# Patient Record
Sex: Female | Born: 1950 | Race: Asian | Hispanic: No | Smoking: Never smoker
Health system: Southern US, Community
[De-identification: ages and names within clinical notes are randomized; demographics above are authoritative.]

---

## 2019-08-20 ENCOUNTER — Ambulatory Visit: Payer: Self-pay | Attending: Family

## 2019-08-20 DIAGNOSIS — Z23 Encounter for immunization: Secondary | ICD-10-CM

## 2019-08-20 NOTE — Progress Notes (Signed)
   Covid-19 Vaccination Clinic  Name:  Catherine Alexander    MRN: [id number] DOB: 05/29/1950  08/20/2019  [name] was observed post Covid-19 immunization for 15 minutes without incident. She was provided with Vaccine Information Sheet and instruction to access the V-Safe system.   [name] was instructed to call 911 with any severe reactions post vaccine: Marland Kitchen Difficulty breathing  . Swelling of face and throat  . A fast heartbeat  . A bad rash all over body  . Dizziness and weakness   Immunizations Administered    Name Date Dose VIS Date Route   Moderna COVID-19 Vaccine 08/20/2019  3:46 PM 0.5 mL 04/28/2019 Intramuscular   Manufacturer: Moderna   Lot: [id number]   [id number]

## 2019-09-22 ENCOUNTER — Ambulatory Visit: Payer: Self-pay | Attending: Family

## 2019-09-22 DIAGNOSIS — Z23 Encounter for immunization: Secondary | ICD-10-CM

## 2019-09-22 NOTE — Progress Notes (Signed)
   Covid-19 Vaccination Clinic  Name:  Loyce Atamian    MRN: [id number] DOB: November 20, 1950  09/22/2019  Ms. Burczyk was observed post Covid-19 immunization for 15 minutes without incident. She was provided with Vaccine Information Sheet and instruction to access the V-Safe system.   Ms. Hoheisel was instructed to call 911 with any severe reactions post vaccine: Marland Kitchen Difficulty breathing  . Swelling of face and throat  . A fast heartbeat  . A bad rash all over body  . Dizziness and weakness   Immunizations Administered    Name Date Dose VIS Date Route   Moderna COVID-19 Vaccine 09/22/2019  3:16 PM 0.5 mL 04/2019 Intramuscular   Manufacturer: Moderna   Lot: [id number]   [place][id number]

## 2020-01-23 ENCOUNTER — Emergency Department (HOSPITAL_COMMUNITY): Payer: Self-pay

## 2020-01-23 ENCOUNTER — Inpatient Hospital Stay (HOSPITAL_COMMUNITY)
Admission: EM | Admit: 2020-01-23 | Discharge: 2020-01-25 | DRG: 640 | Disposition: A | Discharge status: Home / Self Care | Payer: Self-pay | Attending: Internal Medicine | Admitting: Internal Medicine

## 2020-01-23 ENCOUNTER — Encounter (HOSPITAL_COMMUNITY): Payer: Self-pay

## 2020-01-23 DIAGNOSIS — R627 Adult failure to thrive: Secondary | ICD-10-CM | POA: Diagnosis present

## 2020-01-23 DIAGNOSIS — Z20822 Contact with and (suspected) exposure to covid-19: Secondary | ICD-10-CM | POA: Diagnosis present

## 2020-01-23 DIAGNOSIS — Z9183 Wandering in diseases classified elsewhere: Secondary | ICD-10-CM

## 2020-01-23 DIAGNOSIS — S9032XA Contusion of left foot, initial encounter: Secondary | ICD-10-CM

## 2020-01-23 DIAGNOSIS — R238 Other skin changes: Secondary | ICD-10-CM | POA: Diagnosis present

## 2020-01-23 DIAGNOSIS — F03918 Unspecified dementia, unspecified severity, with other behavioral disturbance: Secondary | ICD-10-CM | POA: Diagnosis present

## 2020-01-23 DIAGNOSIS — R748 Abnormal levels of other serum enzymes: Secondary | ICD-10-CM | POA: Diagnosis present

## 2020-01-23 DIAGNOSIS — G20 Parkinson's disease: Secondary | ICD-10-CM | POA: Diagnosis present

## 2020-01-23 DIAGNOSIS — J189 Pneumonia, unspecified organism: Secondary | ICD-10-CM

## 2020-01-23 DIAGNOSIS — W1830XA Fall on same level, unspecified, initial encounter: Secondary | ICD-10-CM | POA: Diagnosis present

## 2020-01-23 DIAGNOSIS — R64 Cachexia: Secondary | ICD-10-CM | POA: Diagnosis present

## 2020-01-23 DIAGNOSIS — D181 Lymphangioma, any site: Secondary | ICD-10-CM | POA: Diagnosis present

## 2020-01-23 DIAGNOSIS — R21 Rash and other nonspecific skin eruption: Secondary | ICD-10-CM | POA: Diagnosis present

## 2020-01-23 DIAGNOSIS — I6203 Nontraumatic chronic subdural hemorrhage: Secondary | ICD-10-CM | POA: Diagnosis present

## 2020-01-23 DIAGNOSIS — F0281 Dementia in other diseases classified elsewhere with behavioral disturbance: Secondary | ICD-10-CM | POA: Diagnosis present

## 2020-01-23 DIAGNOSIS — K5900 Constipation, unspecified: Secondary | ICD-10-CM | POA: Diagnosis present

## 2020-01-23 DIAGNOSIS — S8001XA Contusion of right knee, initial encounter: Secondary | ICD-10-CM

## 2020-01-23 DIAGNOSIS — E860 Dehydration: Principal | ICD-10-CM | POA: Diagnosis present

## 2020-01-23 DIAGNOSIS — R001 Bradycardia, unspecified: Secondary | ICD-10-CM | POA: Diagnosis present

## 2020-01-23 DIAGNOSIS — R451 Restlessness and agitation: Secondary | ICD-10-CM | POA: Diagnosis not present

## 2020-01-23 DIAGNOSIS — S8002XA Contusion of left knee, initial encounter: Secondary | ICD-10-CM

## 2020-01-23 DIAGNOSIS — S065XAA Traumatic subdural hemorrhage with loss of consciousness status unknown, initial encounter: Secondary | ICD-10-CM

## 2020-01-23 LAB — CBC WITH DIFFERENTIAL/PLATELET
Abs Immature Granulocytes: 0.08 10*3/uL — ABNORMAL HIGH (ref 0.00–0.07)
Basophils Absolute: 0 10*3/uL (ref 0.0–0.1)
Basophils Relative: 0 %
Eosinophils Absolute: 0 10*3/uL (ref 0.0–0.5)
Eosinophils Relative: 0 %
HCT: 39.2 % (ref 36.0–46.0)
Hemoglobin: 12.9 g/dL (ref 12.0–15.0)
Immature Granulocytes: 1 %
Lymphocytes Relative: 5 %
Lymphs Abs: 0.7 10*3/uL (ref 0.7–4.0)
MCH: 30.9 pg (ref 26.0–34.0)
MCHC: 32.9 g/dL (ref 30.0–36.0)
MCV: 94 fL (ref 80.0–100.0)
Monocytes Absolute: 0.6 10*3/uL (ref 0.1–1.0)
Monocytes Relative: 4 %
Neutro Abs: 12.8 10*3/uL — ABNORMAL HIGH (ref 1.7–7.7)
Neutrophils Relative %: 90 %
Platelets: 164 10*3/uL (ref 150–400)
RBC: 4.17 MIL/uL (ref 3.87–5.11)
RDW: 12.5 % (ref 11.5–15.5)
WBC: 14.3 10*3/uL — ABNORMAL HIGH (ref 4.0–10.5)
nRBC: 0 % (ref 0.0–0.2)

## 2020-01-23 LAB — TYPE AND SCREEN
ABO/RH(D): B POS
Antibody Screen: NEGATIVE

## 2020-01-23 LAB — COMPREHENSIVE METABOLIC PANEL
ALT: 33 U/L (ref 0–44)
AST: 60 U/L — ABNORMAL HIGH (ref 15–41)
Albumin: 3.5 g/dL (ref 3.5–5.0)
Alkaline Phosphatase: 34 U/L — ABNORMAL LOW (ref 38–126)
Anion gap: 12 (ref 5–15)
BUN: 24 mg/dL — ABNORMAL HIGH (ref 8–23)
CO2: 25 mmol/L (ref 22–32)
Calcium: 9.2 mg/dL (ref 8.9–10.3)
Chloride: 104 mmol/L (ref 98–111)
Creatinine, Ser: 1.05 mg/dL — ABNORMAL HIGH (ref 0.44–1.00)
GFR calc Af Amer: >60 mL/min (ref >60)
GFR calc non Af Amer: 54 mL/min — ABNORMAL LOW (ref >60)
Glucose, Bld: 81 mg/dL (ref 70–99)
Potassium: 3.7 mmol/L (ref 3.5–5.1)
Sodium: 141 mmol/L (ref 135–145)
Total Bilirubin: 1.5 mg/dL — ABNORMAL HIGH (ref 0.3–1.2)
Total Protein: 6.9 g/dL (ref 6.5–8.1)

## 2020-01-23 LAB — URINALYSIS, ROUTINE W REFLEX MICROSCOPIC
Bilirubin Urine: NEGATIVE
Glucose, UA: NEGATIVE mg/dL
Ketones, ur: 20 mg/dL — AB
Leukocytes,Ua: NEGATIVE
Nitrite: NEGATIVE
Protein, ur: 100 mg/dL — AB
Specific Gravity, Urine: 1.019 (ref 1.005–1.030)
pH: 5 (ref 5.0–8.0)

## 2020-01-23 LAB — PROTIME-INR
INR: 1.2 (ref 0.8–1.2)
Prothrombin Time: 14.6 seconds (ref 11.4–15.2)

## 2020-01-23 LAB — SARS CORONAVIRUS 2 BY RT PCR (HOSPITAL ORDER, PERFORMED IN ~~LOC~~ HOSPITAL LAB): SARS Coronavirus 2: NEGATIVE

## 2020-01-23 LAB — CK: Total CK: 951 U/L — ABNORMAL HIGH (ref 38–234)

## 2020-01-23 MED ORDER — SODIUM CHLORIDE 0.9 % IV BOLUS
1000.0000 mL | Freq: Once | INTRAVENOUS | Status: AC
  Administered 2020-01-23: 1000 mL via INTRAVENOUS

## 2020-01-23 NOTE — Discharge Instructions (Signed)
You were seen in the emergency department for evaluation of injuries from a fall.  You had a CAT scan of your head that showed a small subdural hemorrhage on the left.  We reviewed this with Dr. Trenton Gammon neurosurgery who did not feel any intervention was necessary.  You also had x-rays of both of your knees and your left foot that did not show any obvious fractures.

## 2020-01-23 NOTE — ED Notes (Signed)
Pt returns from radiology son at bedside.

## 2020-01-23 NOTE — ED Notes (Signed)
Pt transported to radiology.

## 2020-01-23 NOTE — Progress Notes (Signed)
Elderly female with dementia and PD.  Sp probable fall >24 hrs ago.  Scan shows small left convexity sdh superimposed on a chronic appearing hygroma.  No mass effect.  No anticoagulation use or anti plt therapy.    Rec fu scan in 6 hrs.  If stable then patient may be discharged with family

## 2020-01-23 NOTE — ED Provider Notes (Signed)
Patient with SDH after being found outside, assume fall Plan per neurosurgery, repeat CT at 1:00 If no worse, "stable", she can be discharged Catherine Alexander) In office follow up.   2:00 - I spoke to the patient's son. Patient sleeping now. Apparently, she disappeared at lunch time Friday 8/27 and wasn't found until 4:30 Saturday afternoon behind a neighbor's house sitting under a bush.   Patient with history of Parkinson's, dementia, speaks chinese only. Son assisting with interpretation.   Repeat head CT performed and is unchanged. However, patient's blood pressures have been decreasing steadily, starting with 121/30 on arrival, 91/33 at 1:30. She was given a liter of fluids, additional fluids ordered.   Labs show mildly elevated CK of 951, leukocytosis of 14.3. No UTI evident. Feel the patient would benefit from admission for stabilization, continuation of hydration. Son expresses that care at home is becoming more difficult as the patient requires assistance with most ADL's, and would benefit from SW consult as well to explore care options for her.   Hospitalist paged for admission.    Charlann Lange, PA-C 01/24/20 0229    Hayden Rasmussen, MD 01/24/20 [phone]

## 2020-01-23 NOTE — ED Triage Notes (Signed)
Pt arrive by EMS Per EMS pt ran away from home yesterday  (lives at home with her son ) . Per EMS pt was found in the woods today by [place] dogs [place]. EMS reports pt has an hx of dementia, parkinson diease and only speak chinese mandarin. Upon arrival to room pt has bruises on both knees and left foot toes and some sort of rash on both hands, Pt was immediately clean up and place in gown and on the monitor x 3

## 2020-01-23 NOTE — ED Provider Notes (Signed)
[facility] EMERGENCY DEPARTMENT Provider Note   CSN: [id number] Arrival date & time: 01/23/20  1714     History Chief Complaint  Patient presents with  . Failure To Thrive    Catherine Alexander is a 69 y.o. female.  Level 5 caveat secondary to dementia and language barrier.  She is Mandarin speaking female who lives at home with her son and his wife.  Last seen at 10:30 AM yesterday.  Apparently wandered out of the house and was unable to be located until today.  Please found her in a bush in the woods.  Patient not answering any questions or following commands.  Son states she has a history of Parkinson's but does not take any medication.  Was diagnosed in [place].  The history is provided by the EMS personnel and a relative. The history is limited by a language barrier.  Fall This is a new problem. The current episode started 12 to 24 hours ago. The problem has not changed since onset.Nothing aggravates the symptoms. Nothing relieves the symptoms. She has tried nothing for the symptoms. The treatment provided no relief.       No past medical history on file.  There are no problems to display for this patient.   History reviewed. No pertinent surgical history.   OB History   No obstetric history on file.     No family history on file.  Social History   Tobacco Use  . Smoking status: Not on file  Substance Use Topics  . Alcohol use: Not on file  . Drug use: Not on file    Home Medications Prior to Admission medications   Not on File    Allergies    Patient has no known allergies.  Review of Systems   Review of Systems  Unable to perform ROS: Dementia    Physical Exam Updated Vital Signs BP (!) 125/48   Pulse 74   Temp 98.6 F (37 C) (Rectal)   Resp 16   SpO2 100%   Physical Exam Vitals and nursing note reviewed.  Constitutional:      General: She is not in acute distress.    Appearance: Normal appearance. She is cachectic.  HENT:      Head: Normocephalic and atraumatic.  Eyes:     Conjunctiva/sclera: Conjunctivae normal.  Neck:     Comments: Patient in cervical collar trach midline Cardiovascular:     Rate and Rhythm: Normal rate and regular rhythm.     Heart sounds: No murmur heard.   Pulmonary:     Effort: Pulmonary effort is normal. No respiratory distress.     Breath sounds: Normal breath sounds.  Abdominal:     Palpations: Abdomen is soft.     Tenderness: There is no abdominal tenderness.  Musculoskeletal:        General: Signs of injury present. Normal range of motion.     Comments: She has full range of motion of her upper extremities without any pain or limitations.  She has evidence of abrasions or bites on both of her forearms and hands.  Lower extremity she has abrasions and contusion of both knees.  Left foot has some bruising on her toes.  No gross deformities.  Skin:    General: Skin is warm and dry.     Capillary Refill: Capillary refill takes less than 2 seconds.  Neurological:     General: No focal deficit present.     Comments: Patient initially awake not  following commands.  Withdraws all 4 extremities equally.  No focal findings.  Further neurologic evaluation documented later in chart.     ED Results / Procedures / Treatments   Labs (all labs ordered are listed, but only abnormal results are displayed) Labs Reviewed  COMPREHENSIVE METABOLIC PANEL - Abnormal; Notable for the following components:      Result Value   BUN 24 (*)    Creatinine, Ser 1.05 (*)    AST 60 (*)    Alkaline Phosphatase 34 (*)    Total Bilirubin 1.5 (*)    GFR calc non Af Amer 54 (*)    All other components within normal limits  CBC WITH DIFFERENTIAL/PLATELET - Abnormal; Notable for the following components:   WBC 14.3 (*)    Neutro Abs 12.8 (*)    Abs Immature Granulocytes 0.08 (*)    All other components within normal limits  CK - Abnormal; Notable for the following components:   Total CK 951 (*)    All  other components within normal limits  URINALYSIS, ROUTINE W REFLEX MICROSCOPIC - Abnormal; Notable for the following components:   Color, Urine AMBER (*)    APPearance HAZY (*)    Hgb urine dipstick MODERATE (*)    Ketones, ur 20 (*)    Protein, ur 100 (*)    Bacteria, UA RARE (*)    All other components within normal limits  SARS CORONAVIRUS 2 BY RT PCR (HOSPITAL ORDER, [facility])  URINE CULTURE  PROTIME-INR  TYPE AND SCREEN  ABO/RH    EKG None  Radiology CT Head Wo Contrast  Result Date: 01/23/2020 CLINICAL DATA:  Head and neck trauma with pain. EXAM: CT HEAD WITHOUT CONTRAST CT CERVICAL SPINE WITHOUT CONTRAST TECHNIQUE: Multidetector CT imaging of the head and cervical spine was performed following the standard protocol without intravenous contrast. Multiplanar CT image reconstructions of the cervical spine were also generated. COMPARISON:  None. FINDINGS: CT HEAD FINDINGS Brain: Small to moderate volume acute subdural blood products overlie the left frontal and temporal lobes. A subdural hygroma overlies the left cerebral convexity and may reflect chronic subdural hemorrhage. There is no significant associated mass effect. No evidence of acute infarction, hydrocephalus, or midline shift. There is mild cerebral volume loss with associated ex vacuo dilatation. Vascular: There are vascular calcifications in the carotid siphons. Skull: Normal. Negative for fracture or focal lesion. Sinuses/Orbits: No acute finding. Other: None. CT CERVICAL SPINE FINDINGS Alignment: Normal. Skull base and vertebrae: No acute fracture. No primary bone lesion or focal pathologic process. Soft tissues and spinal canal: No prevertebral fluid or swelling. No visible canal hematoma. Disc levels:  Mild degenerative changes are noted. Upper chest: Negative. Other: Biapical pleural scarring is partially imaged. IMPRESSION: 1. Small to moderate volume acute subdural hemorrhage overlying the  left frontal and temporal lobes. No significant associated mass effect or midline shift. 2. 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 3. No acute fracture or subluxation of the cervical spine. These results were called by telephone at the time of interpretation on 01/23/2020 at 7:22 pm to provider [facility] , who verbally acknowledged these results. Electronically Signed   By: Zerita Boers M.D.   On: 01/23/2020 19:22   CT Cervical Spine Wo Contrast  Result Date: 01/23/2020 CLINICAL DATA:  Head and neck trauma with pain. EXAM: CT HEAD WITHOUT CONTRAST CT CERVICAL SPINE WITHOUT CONTRAST TECHNIQUE: Multidetector CT imaging of the head  and cervical spine was performed following the standard protocol without intravenous contrast. Multiplanar CT image reconstructions of the cervical spine were also generated. COMPARISON:  None. FINDINGS: CT HEAD FINDINGS Brain: Small to moderate volume acute subdural blood products overlie the left frontal and temporal lobes. A subdural hygroma overlies the left cerebral convexity and may reflect chronic subdural hemorrhage. There is no significant associated mass effect. No evidence of acute infarction, hydrocephalus, or midline shift. There is mild cerebral volume loss with associated ex vacuo dilatation. Vascular: There are vascular calcifications in the carotid siphons. Skull: Normal. Negative for fracture or focal lesion. Sinuses/Orbits: No acute finding. Other: None. CT CERVICAL SPINE FINDINGS Alignment: Normal. Skull base and vertebrae: No acute fracture. No primary bone lesion or focal pathologic process. Soft tissues and spinal canal: No prevertebral fluid or swelling. No visible canal hematoma. Disc levels:  Mild degenerative changes are noted. Upper chest: Negative. Other: Biapical pleural scarring is partially imaged. IMPRESSION: 1. Small to moderate volume acute subdural hemorrhage overlying the left  frontal and temporal lobes. No significant associated mass effect or midline shift. 2. 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 3. No acute fracture or subluxation of the cervical spine. These results were called by telephone at the time of interpretation on 01/23/2020 at 7:22 pm to provider [facility] , who verbally acknowledged these results. Electronically Signed   By: Zerita Boers M.D.   On: 01/23/2020 19:22   DG Knee Complete 4 Views Left  Result Date: 01/23/2020 CLINICAL DATA:  Fall with knee pain EXAM: LEFT KNEE - COMPLETE 4+ VIEW COMPARISON:  None. FINDINGS: No evidence of fracture, dislocation, or joint effusion. No evidence of arthropathy or other focal bone abnormality. Soft tissues are unremarkable. IMPRESSION: Negative. Electronically Signed   By: Zerita Boers M.D.   On: 01/23/2020 18:40   DG Knee Complete 4 Views Right  Result Date: 01/23/2020 CLINICAL DATA:  Fall with knee pain EXAM: RIGHT KNEE - COMPLETE 4+ VIEW COMPARISON:  None. FINDINGS: No evidence of fracture, dislocation, or joint effusion. No evidence of arthropathy or other focal bone abnormality. Soft tissues are unremarkable. IMPRESSION: Negative. Electronically Signed   By: Zerita Boers M.D.   On: 01/23/2020 18:41   DG Foot Complete Left  Result Date: 01/23/2020 CLINICAL DATA:  Fall with foot pain. EXAM: LEFT FOOT - COMPLETE 3+ VIEW COMPARISON:  None. FINDINGS: There is no evidence of fracture or dislocation. There is no evidence of arthropathy or other focal bone abnormality. Soft tissues are unremarkable. IMPRESSION: Negative. Electronically Signed   By: Zerita Boers M.D.   On: 01/23/2020 18:42    Procedures Procedures (including critical care time)  Medications Ordered in ED Medications  sodium chloride 0.9 % bolus 1,000 mL (0 mLs Intravenous Stopped 01/23/20 2001)    ED Course  I have reviewed the triage vital signs and the nursing  notes.  Pertinent labs & imaging results that were available during my care of the patient were reviewed by me and considered in my medical decision making (see chart for details).  Clinical Course as of Jan 23 2315  Sat Jan 23, 2020  1922 Received a call from radiology patient has a left subdural mild to moderate.  No evidence of herniation.  Also he noted some hygroma there so does not know if this played into a chronic area.   [MB]  1928 I was able to get a little bit better neurologic exam with her  son's assistance.  She denies having headache.  She is able to lift all 4 extremities without any obvious weakness.  No facial asymmetry.   [MB]  1956 Discussed with neurosurgery Dr. Trenton Gammon.  He felt that the subdural was not very large and she would need a repeat scan in 6 hours.  If this is unchanged he felt she could be discharged.  Will review with patient's son.   [MB]  2221 Continues to open eyes to voice.  She will follow me around the room with her eyes. She will grasp my hands with equal grip bilaterally.   [SJ]    Clinical Course User Index [MB] Hayden Rasmussen, MD [SJ] Joy, Helane Gunther, PA-C   MDM Rules/Calculators/A&P                         This patient complains of found down outside after being missing for almost 20 hours.; this involves an extensive number of treatment Options and is a complaint that carries with it a high risk of complications and Morbidity. The differential includes dehydration, rhabdomyolysis, stroke, bleed, fracture, contusion  I ordered, reviewed and interpreted labs, which included CBC with elevated white count, normal hemoglobin, chemistries fairly normal other than mild elevation in creatinine, CPK mildly elevated 951, Covid testing negative I ordered medication IV fluids I ordered imaging studies which included CT head CT C-spine along with x-rays of her chest bilateral knees and left foot and I independently    visualized and interpreted imaging  which showed acute subdural left with no shift Additional history obtained from EMS, patient's son Previous records obtained and reviewed in epic, no recent visits I consulted neurosurgery Dr. Trenton Gammon and discussed lab and imaging findings  Critical Interventions: None  After the interventions stated above, I reevaluated the patient and found patient to be more alert and interactive and conversant with her son and Mandarin.  Showing no focal neurologic signs.  Denies any headache.  Per neurosurgery recommendations we will get a repeat head CT and if no signs of worsening will discharge to follow-up outpatient as needed.  Reviewed this with the patient's son who is comfortable with plan.  Signed out to [place] who will reassess the patient after 6-hour CT or signed out to oncoming provider.  Final Clinical Impression(s) / ED Diagnoses Final diagnoses:  SDH (subdural hematoma) (HCC)  Contusion of left knee, initial encounter  Contusion of right knee, initial encounter  Contusion of left foot, initial encounter    Rx / DC Orders ED Discharge Orders    None       Hayden Rasmussen, MD 01/23/20 [phone]

## 2020-01-24 ENCOUNTER — Other Ambulatory Visit: Payer: Self-pay

## 2020-01-24 ENCOUNTER — Observation Stay (HOSPITAL_COMMUNITY): Payer: Self-pay

## 2020-01-24 ENCOUNTER — Emergency Department (HOSPITAL_COMMUNITY): Payer: Self-pay

## 2020-01-24 ENCOUNTER — Encounter (HOSPITAL_COMMUNITY): Payer: Self-pay | Admitting: Internal Medicine

## 2020-01-24 DIAGNOSIS — S065X9A Traumatic subdural hemorrhage with loss of consciousness of unspecified duration, initial encounter: Secondary | ICD-10-CM

## 2020-01-24 DIAGNOSIS — F03918 Unspecified dementia, unspecified severity, with other behavioral disturbance: Secondary | ICD-10-CM | POA: Diagnosis present

## 2020-01-24 DIAGNOSIS — F0391 Unspecified dementia with behavioral disturbance: Secondary | ICD-10-CM

## 2020-01-24 DIAGNOSIS — R238 Other skin changes: Secondary | ICD-10-CM | POA: Diagnosis present

## 2020-01-24 DIAGNOSIS — R748 Abnormal levels of other serum enzymes: Secondary | ICD-10-CM

## 2020-01-24 DIAGNOSIS — E860 Dehydration: Secondary | ICD-10-CM | POA: Diagnosis present

## 2020-01-24 DIAGNOSIS — S065XAA Traumatic subdural hemorrhage with loss of consciousness status unknown, initial encounter: Secondary | ICD-10-CM

## 2020-01-24 DIAGNOSIS — G20 Parkinson's disease: Secondary | ICD-10-CM | POA: Diagnosis present

## 2020-01-24 HISTORY — DX: Unspecified dementia with behavioral disturbance: F03.91

## 2020-01-24 HISTORY — DX: Unspecified dementia, unspecified severity, with other behavioral disturbance: F03.918

## 2020-01-24 LAB — MAGNESIUM: Magnesium: 1.8 mg/dL (ref 1.7–2.4)

## 2020-01-24 LAB — CBC WITH DIFFERENTIAL/PLATELET
Abs Immature Granulocytes: 0.05 10*3/uL (ref 0.00–0.07)
Basophils Absolute: 0 10*3/uL (ref 0.0–0.1)
Basophils Relative: 0 %
Eosinophils Absolute: 0 10*3/uL (ref 0.0–0.5)
Eosinophils Relative: 0 %
HCT: 37.8 % (ref 36.0–46.0)
Hemoglobin: 12.3 g/dL (ref 12.0–15.0)
Immature Granulocytes: 0 %
Lymphocytes Relative: 6 %
Lymphs Abs: 0.7 10*3/uL (ref 0.7–4.0)
MCH: 31 pg (ref 26.0–34.0)
MCHC: 32.5 g/dL (ref 30.0–36.0)
MCV: 95.2 fL (ref 80.0–100.0)
Monocytes Absolute: 0.6 10*3/uL (ref 0.1–1.0)
Monocytes Relative: 5 %
Neutro Abs: 10.3 10*3/uL — ABNORMAL HIGH (ref 1.7–7.7)
Neutrophils Relative %: 89 %
Platelets: 151 10*3/uL (ref 150–400)
RBC: 3.97 MIL/uL (ref 3.87–5.11)
RDW: 12.5 % (ref 11.5–15.5)
WBC: 11.7 10*3/uL — ABNORMAL HIGH (ref 4.0–10.5)
nRBC: 0 % (ref 0.0–0.2)

## 2020-01-24 LAB — COMPREHENSIVE METABOLIC PANEL
ALT: 29 U/L (ref 0–44)
AST: 53 U/L — ABNORMAL HIGH (ref 15–41)
Albumin: 3 g/dL — ABNORMAL LOW (ref 3.5–5.0)
Alkaline Phosphatase: 33 U/L — ABNORMAL LOW (ref 38–126)
Anion gap: 10 (ref 5–15)
BUN: 20 mg/dL (ref 8–23)
CO2: 22 mmol/L (ref 22–32)
Calcium: 8.5 mg/dL — ABNORMAL LOW (ref 8.9–10.3)
Chloride: 109 mmol/L (ref 98–111)
Creatinine, Ser: 0.79 mg/dL (ref 0.44–1.00)
GFR calc Af Amer: >60 mL/min (ref >60)
GFR calc non Af Amer: >60 mL/min (ref >60)
Glucose, Bld: 63 mg/dL — ABNORMAL LOW (ref 70–99)
Potassium: 3.8 mmol/L (ref 3.5–5.1)
Sodium: 141 mmol/L (ref 135–145)
Total Bilirubin: 1.3 mg/dL — ABNORMAL HIGH (ref 0.3–1.2)
Total Protein: 6.2 g/dL — ABNORMAL LOW (ref 6.5–8.1)

## 2020-01-24 LAB — TSH: TSH: 1.984 u[IU]/mL (ref 0.350–4.500)

## 2020-01-24 LAB — HIV ANTIBODY (ROUTINE TESTING W REFLEX): HIV Screen 4th Generation wRfx: NONREACTIVE

## 2020-01-24 LAB — VITAMIN B12: Vitamin B-12: 895 pg/mL (ref 180–914)

## 2020-01-24 LAB — CK: Total CK: 619 U/L — ABNORMAL HIGH (ref 38–234)

## 2020-01-24 LAB — ABO/RH: ABO/RH(D): B POS

## 2020-01-24 LAB — FOLATE: Folate: 30.3 ng/mL (ref >5.9)

## 2020-01-24 MED ORDER — POLYETHYLENE GLYCOL 3350 17 G PO PACK
17.0000 g | PACK | Freq: Every day | ORAL | Status: DC | PRN
  Administered 2020-01-24: 17 g via ORAL
  Filled 2020-01-24: qty 1

## 2020-01-24 MED ORDER — LACTATED RINGERS IV SOLN: INTRAVENOUS | Status: AC

## 2020-01-24 MED ORDER — ONDANSETRON HCL 4 MG PO TABS: 4.0000 mg | ORAL_TABLET | Freq: Four times a day (QID) | ORAL | Status: DC | PRN

## 2020-01-24 MED ORDER — ACETAMINOPHEN 325 MG PO TABS: 650.0000 mg | ORAL_TABLET | Freq: Four times a day (QID) | ORAL | Status: DC | PRN

## 2020-01-24 MED ORDER — LACTATED RINGERS IV SOLN: INTRAVENOUS | Status: DC

## 2020-01-24 MED ORDER — SODIUM CHLORIDE 0.9 % IV BOLUS
1000.0000 mL | Freq: Once | INTRAVENOUS | Status: AC
  Administered 2020-01-24: 1000 mL via INTRAVENOUS

## 2020-01-24 MED ORDER — ONDANSETRON HCL 4 MG/2ML IJ SOLN: 4.0000 mg | Freq: Four times a day (QID) | INTRAMUSCULAR | Status: DC | PRN

## 2020-01-24 MED ORDER — ACETAMINOPHEN 650 MG RE SUPP: 650.0000 mg | Freq: Four times a day (QID) | RECTAL | Status: DC | PRN

## 2020-01-24 NOTE — ED Notes (Signed)
Patient transported to CT 

## 2020-01-24 NOTE — H&P (Addendum)
History and Physical    Catherine Alexander ERX:[id number] DOB: Dec 15, 1950 DOA: 01/23/2020  PCP: No primary care provider on file.  Patient coming from: Home   Chief Complaint:  Chief Complaint  Patient presents with  . Failure To Thrive     HPI:    69 year old [place] speaking female with past medical history of advanced dementia, Parkinson disease who presents to [facility] emergency department, brought in by EMS after the patient had run away from home, being found behind a neighbor's house sitting under a bush.  Patient is an extremely poor historian secondary to advanced dementia which is further complicated by language barrier.  I have attempted to contact the son to obtain further history but have been unsuccessful as Catherine is not answering the phone.  Therefore the majority of this history has been obtained from discussions with the emergency department staff/documentation.  Patient apparently escaped from her home at lunch on Friday.  Family could not locate her until approximately 4:30 PM the following day on Saturday.  Patient had likely been wandering in the woods and was found behind a neighbor's house sitting under a bush.  Upon being found, patient was lethargic and not at baseline and therefore was brought in by EMS for evaluation.  Upon evaluation by the emergency department staff, CT imaging of the head revealed small to moderate sized subdural hemorrhage of the left frontal and temporal lobes.  Case was discussed with Dr. Trenton Gammon with orthopedic surgery who recommended observation for at least 6 hours and repeat CT imaging with discharge home if there was no change.  Patient was also found to be clinically dehydrated with borderline low blood pressures, bradycardia and somewhat elevated creatine kinase of 951.  Hospitalist group was then called to assess the patient for admission to the hospital.   Review of Systems: Unable to obtain due to patient advanced dementia.   Past  Medical History:  Diagnosis Date  . Dementia with behavioral disturbance ([place]) 01/24/2020    History reviewed. No pertinent surgical history.   reports that she has never smoked. She has never used smokeless tobacco. No history on file for alcohol use and drug use.  No Known Allergies  Family History  Problem Relation Age of Onset  . Heart disease Neg Hx      Prior to Admission medications   Not on File    Physical Exam: Vitals:   01/24/20 0300 01/24/20 0340 01/24/20 0400 01/24/20 0500  BP: (!) 96/47 (!) 94/38 (!) 106/44 (!) 112/46  Pulse: (!) 48 (!) 46 (!) 45 (!) 58  Resp: 18 15 14 16   Temp: 98.7 F (37.1 C)     TempSrc: Rectal     SpO2: 100%       Constitutional: Lethargic but arousable, not in any acute distress. Skin: Numerous excoriations and abrasions over all extremities.  Notable purplish discoloration of the second and fourth toes of the left foot.  Extremely poor skin turgor noted. Eyes: Pupils are equally reactive to light.  No evidence of scleral icterus or conjunctival pallor.  ENMT: Moist mucous membranes noted.  Posterior pharynx clear of any exudate or lesions.   Neck: normal, supple, no masses, no thyromegaly.  No evidence of jugular venous distension.   Respiratory: clear to auscultation bilaterally, no wheezing, no crackles. Normal respiratory effort. No accessory muscle use.  Cardiovascular: Notable bradycardic rhythm and regular rate, no murmurs / rubs / gallops. No extremity edema.  All pulses present however there is slow  capillary refill of the second and fourth toe of the left foot.  No carotid bruits.  Chest:   Nontender without crepitus or deformity.   Back:   Nontender without crepitus or deformity. Abdomen: Abdomen is soft and nontender.  No evidence of intra-abdominal masses.  Positive bowel sounds noted in all quadrants.   Musculoskeletal: Notable poor muscle tone.  No joint deformity upper and lower extremities. Good ROM, no contractures.    Neurologic: Patient is responsive to both verbal and painful stimuli.  Patient is lethargic but arousable.  Patient is not following commands although this is limited by the language barrier. Psychiatric: Unable to fully assess due to advanced dementia.  Patient currently does not seem to possess insight as to her current situation.   Labs on Admission: I have personally reviewed following labs and imaging studies -   CBC: Recent Labs  Lab 01/23/20 1902  WBC 14.3*  NEUTROABS 12.8*  HGB 12.9  HCT 39.2  MCV 94.0  PLT 607   Basic Metabolic Panel: Recent Labs  Lab 01/23/20 1902  NA 141  K 3.7  CL 104  CO2 25  GLUCOSE 81  BUN 24*  CREATININE 1.05*  CALCIUM 9.2   GFR: CrCl cannot be calculated (Unknown ideal weight.). Liver Function Tests: Recent Labs  Lab 01/23/20 1902  AST 60*  ALT 33  ALKPHOS 34*  BILITOT 1.5*  PROT 6.9  ALBUMIN 3.5   No results for input(s): LIPASE, AMYLASE in the last 168 hours. No results for input(s): AMMONIA in the last 168 hours. Coagulation Profile: Recent Labs  Lab 01/23/20 2022  INR 1.2   Cardiac Enzymes: Recent Labs  Lab 01/23/20 1902  CKTOTAL 951*   BNP (last 3 results) No results for input(s): PROBNP in the last 8760 hours. HbA1C: No results for input(s): HGBA1C in the last 72 hours. CBG: No results for input(s): GLUCAP in the last 168 hours. Lipid Profile: No results for input(s): CHOL, HDL, LDLCALC, TRIG, CHOLHDL, LDLDIRECT in the last 72 hours. Thyroid Function Tests: No results for input(s): TSH, T4TOTAL, FREET4, T3FREE, THYROIDAB in the last 72 hours. Anemia Panel: No results for input(s): VITAMINB12, FOLATE, FERRITIN, TIBC, IRON, RETICCTPCT in the last 72 hours. Urine analysis:    Component Value Date/Time   COLORURINE AMBER (A) 01/23/2020 1753   APPEARANCEUR HAZY (A) 01/23/2020 1753   LABSPEC 1.019 01/23/2020 1753   PHURINE 5.0 01/23/2020 1753   GLUCOSEU NEGATIVE 01/23/2020 1753   HGBUR MODERATE (A)  01/23/2020 1753   BILIRUBINUR NEGATIVE 01/23/2020 1753   KETONESUR 20 (A) 01/23/2020 1753   PROTEINUR 100 (A) 01/23/2020 1753   NITRITE NEGATIVE 01/23/2020 1753   LEUKOCYTESUR NEGATIVE 01/23/2020 1753    Radiological Exams on Admission - Personally Reviewed: CT Head Wo Contrast  Result Date: 01/24/2020 CLINICAL DATA:  69 year old female with follow-up subdural hemorrhage. EXAM: CT HEAD WITHOUT CONTRAST TECHNIQUE: Contiguous axial images were obtained from the base of the skull through the vertex without intravenous contrast. COMPARISON:  Head CT dated 01/23/2020. FINDINGS: Brain: No significant interval change in the subdural hemorrhage over the left frontal and temporal lobes compared to the earlier CT. Small posterior parafalcine subdural hemorrhage appears relatively similar to prior CT. No new bleed. Stable left hemispheric subdural hygroma. There is mild age-related atrophy and chronic microvascular ischemic changes. There is no mass effect or midline shift. Vascular: No hyperdense vessel or unexpected calcification. Skull: Normal. Negative for fracture or focal lesion. Sinuses/Orbits: Mild mucoperiosteal thickening of paranasal sinuses. The mastoid air  cells are clear. No air-fluid level. Other: None IMPRESSION: 1. No significant interval change in the size of the left frontotemporal or posterior para falcine subdural hemorrhages compared to the earlier CT. No new bleed. Continued follow-up recommended. 2. Stable left hemispheric subdural hygroma. 3. Mild age-related atrophy and chronic microvascular ischemic changes. Electronically Signed   By: Anner Crete M.D.   On: 01/24/2020 00:54   CT Head Wo Contrast  Result Date: 01/23/2020 CLINICAL DATA:  Head and neck trauma with pain. EXAM: CT HEAD WITHOUT CONTRAST CT CERVICAL SPINE WITHOUT CONTRAST TECHNIQUE: Multidetector CT imaging of the head and cervical spine was performed following the standard protocol without intravenous contrast.  Multiplanar CT image reconstructions of the cervical spine were also generated. COMPARISON:  None. FINDINGS: CT HEAD FINDINGS Brain: Small to moderate volume acute subdural blood products overlie the left frontal and temporal lobes. A subdural hygroma overlies the left cerebral convexity and may reflect chronic subdural hemorrhage. There is no significant associated mass effect. No evidence of acute infarction, hydrocephalus, or midline shift. There is mild cerebral volume loss with associated ex vacuo dilatation. Vascular: There are vascular calcifications in the carotid siphons. Skull: Normal. Negative for fracture or focal lesion. Sinuses/Orbits: No acute finding. Other: None. CT CERVICAL SPINE FINDINGS Alignment: Normal. Skull base and vertebrae: No acute fracture. No primary bone lesion or focal pathologic process. Soft tissues and spinal canal: No prevertebral fluid or swelling. No visible canal hematoma. Disc levels:  Mild degenerative changes are noted. Upper chest: Negative. Other: Biapical pleural scarring is partially imaged. IMPRESSION: 1. Small to moderate volume acute subdural hemorrhage overlying the left frontal and temporal lobes. No significant associated mass effect or midline shift. 2. 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 3. No acute fracture or subluxation of the cervical spine. These results were called by telephone at the time of interpretation on 01/23/2020 at 7:22 pm to provider [facility] , who verbally acknowledged these results. Electronically Signed   By: Zerita Boers M.D.   On: 01/23/2020 19:22   CT Cervical Spine Wo Contrast  Result Date: 01/23/2020 CLINICAL DATA:  Head and neck trauma with pain. EXAM: CT HEAD WITHOUT CONTRAST CT CERVICAL SPINE WITHOUT CONTRAST TECHNIQUE: Multidetector CT imaging of the head and cervical spine was performed following the standard protocol without intravenous contrast.  Multiplanar CT image reconstructions of the cervical spine were also generated. COMPARISON:  None. FINDINGS: CT HEAD FINDINGS Brain: Small to moderate volume acute subdural blood products overlie the left frontal and temporal lobes. A subdural hygroma overlies the left cerebral convexity and may reflect chronic subdural hemorrhage. There is no significant associated mass effect. No evidence of acute infarction, hydrocephalus, or midline shift. There is mild cerebral volume loss with associated ex vacuo dilatation. Vascular: There are vascular calcifications in the carotid siphons. Skull: Normal. Negative for fracture or focal lesion. Sinuses/Orbits: No acute finding. Other: None. CT CERVICAL SPINE FINDINGS Alignment: Normal. Skull base and vertebrae: No acute fracture. No primary bone lesion or focal pathologic process. Soft tissues and spinal canal: No prevertebral fluid or swelling. No visible canal hematoma. Disc levels:  Mild degenerative changes are noted. Upper chest: Negative. Other: Biapical pleural scarring is partially imaged. IMPRESSION: 1. Small to moderate volume acute subdural hemorrhage overlying the left frontal and temporal lobes. No significant associated mass effect or midline shift. 2. 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 3. No acute fracture  or subluxation of the cervical spine. These results were called by telephone at the time of interpretation on 01/23/2020 at 7:22 pm to provider [facility] , who verbally acknowledged these results. Electronically Signed   By: Zerita Boers M.D.   On: 01/23/2020 19:22   DG Knee Complete 4 Views Left  Result Date: 01/23/2020 CLINICAL DATA:  Fall with knee pain EXAM: LEFT KNEE - COMPLETE 4+ VIEW COMPARISON:  None. FINDINGS: No evidence of fracture, dislocation, or joint effusion. No evidence of arthropathy or other focal bone abnormality. Soft tissues are unremarkable. IMPRESSION:  Negative. Electronically Signed   By: Zerita Boers M.D.   On: 01/23/2020 18:40   DG Knee Complete 4 Views Right  Result Date: 01/23/2020 CLINICAL DATA:  Fall with knee pain EXAM: RIGHT KNEE - COMPLETE 4+ VIEW COMPARISON:  None. FINDINGS: No evidence of fracture, dislocation, or joint effusion. No evidence of arthropathy or other focal bone abnormality. Soft tissues are unremarkable. IMPRESSION: Negative. Electronically Signed   By: Zerita Boers M.D.   On: 01/23/2020 18:41   DG Foot Complete Left  Result Date: 01/23/2020 CLINICAL DATA:  Fall with foot pain. EXAM: LEFT FOOT - COMPLETE 3+ VIEW COMPARISON:  None. FINDINGS: There is no evidence of fracture or dislocation. There is no evidence of arthropathy or other focal bone abnormality. Soft tissues are unremarkable. IMPRESSION: Negative. Electronically Signed   By: Zerita Boers M.D.   On: 01/23/2020 18:42    Telemetry: Personally reviewed.  Sinus bradycardia at 50 bpm.  Assessment/Plan Principal Problem:   Dehydration   Clinically patient exhibiting evidence of dehydration with poor skin turgor, dry mucous membranes in the setting of eloping from her home for period of 24 hours  Ordering regular diet, encouraging oral intake  Hydrating with intravenous isotonic fluids  Monitoring for clinical improvement  Active Problems:   SDH (subdural hematoma) (HCC)   Notable subdural hematoma of small to moderate size the left frontal and temporal lobes  Patient is neurologically intact.  Unfortunately due to patient's advanced dementia and the fact that she has run away from her place of residence for at least 24 hours it is unclear as to whether recent head trauma caused these findings.  Case has been reviewed with Dr. Trenton Gammon who has reviewed the images and recommended repeat CT imaging in 6 hours.  If repeat CT imaging reveals no increase in size the patient does not need any further work-up or observation of these subdural  hemorrhages.    Elevated CK   Slight elevated creatine kinase although clinically I do not believe the patient is suffering from rhabdomyolysis  Hydrating patient with intravenous isotonic fluids  Performing serial creatine kinase levels  Monitoring renal function and electrolytes with serial chemistries    Dementia with behavioral disturbance (HCC)   Minimizing uncomfortable stimuli as much as possible  Encouraging family to remain at bedside as much as possible  Due to patient's advanced dementia son mention to emergency department staff that Catherine is having difficulty caring for his mother.  We will obtain physical therapy evaluation as well as case management consultation    Parkinson disease [facility])   According to family, patient is not on pharmacotherapy at home  Conservative management    Abnormal foot color  Notable bluish discoloration of the second and fourth digits of the left foot.  While this may be secondary to localized traumatic injury of these toes while the patient eloped from home in the past 24 hours -digital  ischemia is also possible.  If this is indeed digital limb ischemia it is unlikely to be acute or critical.  Patient may benefit from outpatient evaluation by vascular surgery after a period of observation  Bradycardia   Notable bradycardia upon review of telemetry in the emergency department  Patient is apparently asymptomatic without any episodes of loss of syncope and no reports of lightheadedness  No evidence of heart block on my review of telemetry.  Patient is not on any medications that would precipitate this  Obtaining TSH  Conservative management   Code Status:  Full code Family Communication: Son Catherine Alexander is the patient's surrogate Media planner.  I have attempted to contact him via phone call and have been unsuccessful.  Status is: Observation  The patient remains OBS appropriate and will d/c before 2 midnights.  Dispo: The  patient is from: Home              Anticipated d/c is to: Home              Anticipated d/c date is: 2 days              Patient currently is not medically stable to d/c.        Vernelle Emerald MD [facility] Pager [phone]  If 7PM-7AM, please contact night-coverage [email or web address] Use universal  password for that web site. If you do not have the password, please call the hospital operator.  01/24/2020, 6:19 AM

## 2020-01-24 NOTE — Evaluation (Signed)
Physical Therapy Evaluation Patient Details Name: Catherine Alexander MRN: [id number] DOB: 27-Sep-1950 Today's Date: 01/24/2020   History of Present Illness  Catherine Alexander is a 69 yr old woman who is visiting her Catherine Alexander from [place] who suffers from dementia due to parkinson's disease. She went missing from his home for more than 30 hours. Ultimately police were able to find her due to an image seen on a neighbor's video camera. She was found in a ditch under a bush. She was brought in to the ED and was found to be more confused than usually and dehydrated    Clinical Impression  Pts Catherine Alexander present - Catherine Alexander lives with him and his family.  Today Catherine Alexander walked 200 feet with CGA - starting HR 64bpm, ending 100bpm and no loss of balance.  Catherine Alexander and his wife work 8 hours a day so no one can be with Catherine Alexander 24/7.  Granddaughter showers Catherine Alexander 1-2 x a week.   Catherine Alexander is thinking about better way to lock doors etc.  He is happy to have Catherine Alexander come back to live with them - with the support they have available.  Catherine Alexander has her insurance etc - in [place] - wouldn't work toward memory care in [place].  Catherine Alexander very constipated during my visit - has very huge BM that took 15 min to come out - talked to Catherine Alexander about stool softners (he says this has been a problem for Catherine Alexander).  Catherine Alexander also very low body weight - Catherine Alexander discussed pts diet - toast for breakfast, muffin for lunch and rice for dinner.  I talked about higher protein options.  He said Catherine Alexander doesn't eat or drink unless pushed to do so.  At this point Catherine Alexander doesn't need skilled therapy - only someone with her.  Catherine Alexander signing off.      Follow Up Recommendations No Catherine Alexander follow up;Supervision/Assistance - 24 hour    Equipment Recommendations  None recommended by Catherine Alexander    Recommendations for Other Services       Precautions / Restrictions Precautions Precautions: Fall Precaution Comments: Catherine Alexander denies falls Restrictions Weight Bearing Restrictions: No Other Position/Activity Restrictions: Catherine Alexander with bruising on B knees - they dont seem to hurt       Mobility  Bed Mobility Overal bed mobility: Needs Assistance Bed Mobility: Supine to Sit     Supine to sit: Min assist     General bed mobility comments: unsure if Catherine Alexander knew what i wanted her to do - she needed help to get started getting up  Transfers Overall transfer level: Needs assistance Equipment used: None Transfers: Sit to/from Stand Sit to Stand: Min guard         General transfer comment: Catherine Alexander got up and down several times from a low toilet with min guard only  Ambulation/Gait Ambulation/Gait assistance: Min guard Gait Distance (Feet): 200 Feet Assistive device: None Gait Pattern/deviations: Trunk flexed;Decreased stride length     General Gait Details: Catherine Alexander did well walking - no obvious gait deviations and no loss of balance seen.  Catherine Alexander said walking is her best thing  Financial trader [employer] (Stroke Patients Only)       Balance Overall balance assessment: Mild deficits observed, not formally tested  Pertinent Vitals/Pain Pain Assessment: No/denies pain (Catherine Alexander didnt complain of pain - knees look like they hurt and Catherine Alexander had huge BM x 15 min with no complaints)    Home Living Family/patient expects to be discharged to:: Private residence Living Arrangements: Children Available Help at Discharge: Family;Available PRN/intermittently Type of Home: House       Home Layout: One level Home Equipment: None      Prior Function Level of Independence: Needs assistance   Gait / Transfers Assistance Needed: pts walking is her best thing  - she can walk as she wants  ADL's / Homemaking Assistance Needed: Catherine Alexander needs assist with all toileting and ADLs - she wears diaper and family changes - she doesnt say when she needs to toilet        Hand Dominance        Extremity/Trunk Assessment        Lower Extremity Assessment Lower Extremity Assessment:  Generalized weakness    Cervical / Trunk Assessment Cervical / Trunk Assessment: Normal  Communication   Communication: Prefers language other than [place] (Catherine Alexander interpreted during Catherine Alexander eval - Catherine Alexander didnt say more than 5-6 words in hour)  Cognition Arousal/Alertness: Awake/alert Behavior During Therapy: Flat affect Overall Cognitive Status: History of cognitive impairments - at baseline                                        General Comments General comments (skin integrity, edema, etc.): Catherine Alexander had a huge BM during eval - had to sit on toilet for 15 min and it was stuck much of that time.  Catherine Alexander says Catherine Alexander just uses diaper at home and they change.  Catherine Alexander says consipation is kind of normal for Catherine Alexander    Exercises     Assessment/Plan    Catherine Alexander Assessment Patent does not need any further Catherine Alexander services  Catherine Alexander Problem List         Catherine Alexander Treatment Interventions      Catherine Alexander Goals (Current goals can be found in the Care Plan section)  Acute Rehab Catherine Alexander Goals Patient Stated Goal: Catherine Alexander wants Catherine Alexander to be safe Catherine Alexander Goal Formulation: With family Time For Goal Achievement: 02/05/20 Potential to Achieve Goals: Fair    Frequency     Barriers to discharge        Co-evaluation               AM-PAC Catherine Alexander "6 Clicks" Mobility  Outcome Measure Help needed turning from your back to your side while in a flat bed without using bedrails?: None Help needed moving from lying on your back to sitting on the side of a flat bed without using bedrails?: None Help needed moving to and from a bed to a chair (including a wheelchair)?: A Little Help needed standing up from a chair using your arms (e.g., wheelchair or bedside chair)?: A Little Help needed to walk in hospital room?: A Little Help needed climbing 3-5 steps with a railing? : A Little 6 Click Score: 20    End of Session Equipment Utilized During Treatment: Gait belt Activity Tolerance: Patient tolerated treatment well;No increased pain Patient left: in  chair;with chair alarm set;with call bell/phone within reach;with family/visitor present Nurse Communication: Mobility status;Precautions Catherine Alexander Visit Diagnosis: Unsteadiness on feet (R26.81);Adult, failure to thrive (R62.7)    Time: 5974-1638 Catherine Alexander Time Calculation (min) (ACUTE ONLY): 71 min   Charges:  Catherine Alexander Evaluation $Catherine Alexander Eval Low Complexity: 1 Low Catherine Alexander Treatments $Gait Training: 8-22 mins $Therapeutic Activity: 23-37 mins        01/24/2020   Rande Lawman, Catherine Alexander   Loyal Buba 01/24/2020, 2:40 PM

## 2020-01-24 NOTE — Progress Notes (Addendum)
Ms. 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 She went missing from his home for more than 30 hours. Ultimately police were able to find her due to an image seen on a neighbor's video camera. She was found in a ditch under a bush. She was brought in to the ED and was found to be more confused than usually and dehydrated.   [facility] hospitalists were consulted to admit the patient for further evaluation and treatment. Unfortunately, the patient speaks only [place] and apparently not a dialect available on the video interpretor.   Vital signs are within normal limits. By her laboratory values, she continues to appear dehydrated.  When I visit her, she is pleasantly confused. She is awake and alert. Heart and lung exam is within normal limits. Abdomen is soft, non-tender, non-distended. No cyanosis, clubbing or edema of extremities. The patient has a petechial rash on her hands and feet that her son states was not present prior to admission.  ADDENDUM: I was called back to the patient's room to discuss the patient with her son, He, who had arrived. He states that the patient is simply visiting from [place]. She is staying at his house. He states that the family is usually in and out of the house all day. I explained to him that his mother cannot be safely left at home alone for any amount of time due to safety reasons. I suggested a memory care facility, but this is unlikely as she is a citizen of [place]. The son states that he intends to return her to [place], but not right away. He understands that the patient cannot be left alone at his home. He will have to accompany her back to [place].

## 2020-01-24 NOTE — ED Notes (Addendum)
This RN notified PA of pt decreasing BP (112/45, 91/33, 94/29) Orders receive to administer 500 normal saline bolus will monitor BP   0239 This RN clean and changed linen and brief  and reposition pt in bed

## 2020-01-24 NOTE — ED Notes (Signed)
Attempted report x1. 

## 2020-01-24 NOTE — ED Provider Notes (Signed)
Catherine Alexander is a 69 y.o. female, presenting to the ED for evaluation after she wandered away from her home and was found outside.   Level 5 caveat due to dementia.   HPI from Ronnald Ramp, MD: "Catherine Alexander is a 69 y.o. female.  Level 5 caveat secondary to dementia and language barrier.  She is Mandarin speaking female who lives at home with her son and his wife.  Last seen at 10:30 AM yesterday.  Apparently wandered out of the house and was unable to be located until today.  Please found her in a bush in the woods.  Patient not answering any questions or following commands.  Son states she has a history of Parkinson's but does not take any medication.  Was diagnosed in [place].  The history is provided by the EMS personnel and a relative. The history is limited by a language barrier.  Fall This is a new problem. The current episode started 12 to 24 hours ago. The problem has not changed since onset.Nothing aggravates the symptoms. Nothing relieves the symptoms. She has tried nothing for the symptoms. The treatment provided no relief."     Physical Exam  BP (!) 106/54   Pulse (!) 50   Temp 98.6 F (37 C) (Rectal)   Resp 13   SpO2 100%   Physical Exam Vitals and nursing note reviewed.  Constitutional:      General: She is not in acute distress.    Appearance: She is well-developed. She is cachectic. She is not diaphoretic.  HENT:     Head: Normocephalic and atraumatic.     Mouth/Throat:     Mouth: Mucous membranes are moist.     Pharynx: Oropharynx is clear.  Eyes:     Conjunctiva/sclera: Conjunctivae normal.  Cardiovascular:     Rate and Rhythm: Normal rate and regular rhythm.     Pulses: Normal pulses.          Radial pulses are 2+ on the right side and 2+ on the left side.       Posterior tibial pulses are 2+ on the right side and 2+ on the left side.     Heart sounds: Normal heart sounds.     Comments: Tactile temperature in the extremities appropriate and equal  bilaterally. Pulmonary:     Effort: Pulmonary effort is normal. No respiratory distress.     Breath sounds: Normal breath sounds.  Abdominal:     Palpations: Abdomen is soft.     Tenderness: There is no abdominal tenderness. There is no guarding.  Musculoskeletal:     Cervical back: Neck supple.     Right lower leg: No edema.     Left lower leg: No edema.  Lymphadenopathy:     Cervical: No cervical adenopathy.  Skin:    General: Skin is warm and dry.  Neurological:     Mental Status: She is alert.     Comments: Sensation to light touch grossly intact in the extremities. Patient able to move her extremities equally. Grip strengths equal.  Psychiatric:        Mood and Affect: Mood and affect normal.        Speech: Speech normal.        Behavior: Behavior normal.     ED Course/Procedures     Procedures   Abnormal Labs Reviewed  COMPREHENSIVE METABOLIC PANEL - Abnormal; Notable for the following components:      Result Value   BUN 24 (*)  Creatinine, Ser 1.05 (*)    AST 60 (*)    Alkaline Phosphatase 34 (*)    Total Bilirubin 1.5 (*)    GFR calc non Af Amer 54 (*)    All other components within normal limits  CBC WITH DIFFERENTIAL/PLATELET - Abnormal; Notable for the following components:   WBC 14.3 (*)    Neutro Abs 12.8 (*)    Abs Immature Granulocytes 0.08 (*)    All other components within normal limits  CK - Abnormal; Notable for the following components:   Total CK 951 (*)    All other components within normal limits  URINALYSIS, ROUTINE W REFLEX MICROSCOPIC - Abnormal; Notable for the following components:   Color, Urine AMBER (*)    APPearance HAZY (*)    Hgb urine dipstick MODERATE (*)    Ketones, ur 20 (*)    Protein, ur 100 (*)    Bacteria, UA RARE (*)    All other components within normal limits   CT Head Wo Contrast  Result Date: 01/23/2020 CLINICAL DATA:  Head and neck trauma with pain. EXAM: CT HEAD WITHOUT CONTRAST CT CERVICAL SPINE WITHOUT  CONTRAST TECHNIQUE: Multidetector CT imaging of the head and cervical spine was performed following the standard protocol without intravenous contrast. Multiplanar CT image reconstructions of the cervical spine were also generated. COMPARISON:  None. FINDINGS: CT HEAD FINDINGS Brain: Small to moderate volume acute subdural blood products overlie the left frontal and temporal lobes. A subdural hygroma overlies the left cerebral convexity and may reflect chronic subdural hemorrhage. There is no significant associated mass effect. No evidence of acute infarction, hydrocephalus, or midline shift. There is mild cerebral volume loss with associated ex vacuo dilatation. Vascular: There are vascular calcifications in the carotid siphons. Skull: Normal. Negative for fracture or focal lesion. Sinuses/Orbits: No acute finding. Other: None. CT CERVICAL SPINE FINDINGS Alignment: Normal. Skull base and vertebrae: No acute fracture. No primary bone lesion or focal pathologic process. Soft tissues and spinal canal: No prevertebral fluid or swelling. No visible canal hematoma. Disc levels:  Mild degenerative changes are noted. Upper chest: Negative. Other: Biapical pleural scarring is partially imaged. IMPRESSION: 1. Small to moderate volume acute subdural hemorrhage overlying the left frontal and temporal lobes. No significant associated mass effect or midline shift. 2. 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 3. No acute fracture or subluxation of the cervical spine. These results were called by telephone at the time of interpretation on 01/23/2020 at 7:22 pm to provider [facility] , who verbally acknowledged these results. Electronically Signed   By: Zerita Boers M.D.   On: 01/23/2020 19:22   CT Cervical Spine Wo Contrast  Result Date: 01/23/2020 CLINICAL DATA:  Head and neck trauma with pain. EXAM: CT HEAD WITHOUT CONTRAST CT CERVICAL SPINE WITHOUT CONTRAST  TECHNIQUE: Multidetector CT imaging of the head and cervical spine was performed following the standard protocol without intravenous contrast. Multiplanar CT image reconstructions of the cervical spine were also generated. COMPARISON:  None. FINDINGS: CT HEAD FINDINGS Brain: Small to moderate volume acute subdural blood products overlie the left frontal and temporal lobes. A subdural hygroma overlies the left cerebral convexity and may reflect chronic subdural hemorrhage. There is no significant associated mass effect. No evidence of acute infarction, hydrocephalus, or midline shift. There is mild cerebral volume loss with associated ex vacuo dilatation. Vascular: There are vascular calcifications in the carotid siphons. Skull: Normal. Negative for fracture or focal  lesion. Sinuses/Orbits: No acute finding. Other: None. CT CERVICAL SPINE FINDINGS Alignment: Normal. Skull base and vertebrae: No acute fracture. No primary bone lesion or focal pathologic process. Soft tissues and spinal canal: No prevertebral fluid or swelling. No visible canal hematoma. Disc levels:  Mild degenerative changes are noted. Upper chest: Negative. Other: Biapical pleural scarring is partially imaged. IMPRESSION: 1. Small to moderate volume acute subdural hemorrhage overlying the left frontal and temporal lobes. No significant associated mass effect or midline shift. 2. 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 3. No acute fracture or subluxation of the cervical spine. These results were called by telephone at the time of interpretation on 01/23/2020 at 7:22 pm to provider [facility] , who verbally acknowledged these results. Electronically Signed   By: Zerita Boers M.D.   On: 01/23/2020 19:22   DG Knee Complete 4 Views Left  Result Date: 01/23/2020 CLINICAL DATA:  Fall with knee pain EXAM: LEFT KNEE - COMPLETE 4+ VIEW COMPARISON:  None. FINDINGS: No evidence of  fracture, dislocation, or joint effusion. No evidence of arthropathy or other focal bone abnormality. Soft tissues are unremarkable. IMPRESSION: Negative. Electronically Signed   By: Zerita Boers M.D.   On: 01/23/2020 18:40   DG Knee Complete 4 Views Right  Result Date: 01/23/2020 CLINICAL DATA:  Fall with knee pain EXAM: RIGHT KNEE - COMPLETE 4+ VIEW COMPARISON:  None. FINDINGS: No evidence of fracture, dislocation, or joint effusion. No evidence of arthropathy or other focal bone abnormality. Soft tissues are unremarkable. IMPRESSION: Negative. Electronically Signed   By: Zerita Boers M.D.   On: 01/23/2020 18:41   DG Foot Complete Left  Result Date: 01/23/2020 CLINICAL DATA:  Fall with foot pain. EXAM: LEFT FOOT - COMPLETE 3+ VIEW COMPARISON:  None. FINDINGS: There is no evidence of fracture or dislocation. There is no evidence of arthropathy or other focal bone abnormality. Soft tissues are unremarkable. IMPRESSION: Negative. Electronically Signed   By: Zerita Boers M.D.   On: 01/23/2020 18:42      MDM     Clinical Course as of Jan 23 13  Sat Jan 23, 2020  1922 Received a call from radiology patient has a left subdural mild to moderate.  No evidence of herniation.  Also he noted some hygroma there so does not know if this played into a chronic area.   [MB]  1928 I was able to get a little bit better neurologic exam with her son's assistance.  She denies having headache.  She is able to lift all 4 extremities without any obvious weakness.  No facial asymmetry.   [MB]  1956 Discussed with neurosurgery Dr. Trenton Gammon.  He felt that the subdural was not very large and she would need a repeat scan in 6 hours.  If this is unchanged he felt she could be discharged.  Will review with patient's son.   [MB]  2221 Continues to open eyes to voice.  She will follow me around the room with her eyes. She will grasp my hands with equal grip bilaterally.   [SJ]    Clinical Course User Index [MB]  Hayden Rasmussen, MD [SJ] Lorayne Bender, PA-C   Patient care handoff report received from Ronnald Ramp, MD. Plan: Reevaluate patient.  Repeat head CT 6 hours after the first head CT, which would be around 1 AM.  If SDH stable, plan for discharge home with neurosurgery follow-up in the office.  Patient's exam  appears to be stable during my time with the patient.   I reviewed her labs and available imaging studies. Apparent dehydration noted.   At the end of my shift, patient care was handed off to [employer], PA-C. Plan: Repeat head CT around 1 AM.  If stable, discharge with plan for follow-up with neurosurgery.  Vitals:   01/23/20 2000 01/23/20 2100 01/23/20 2200 01/23/20 2300  BP: (!) 125/48 (!) 110/57 116/60 (!) 106/54  Pulse: 74 (!) 57 (!) 59 (!) 50  Resp: 16 16 16 13   Temp:      TempSrc:      SpO2:          Lorayne Bender, PA-C 01/24/20 0018    Hayden Rasmussen, MD 01/24/20 [phone]

## 2020-01-25 LAB — MAGNESIUM: Magnesium: 1.6 mg/dL — ABNORMAL LOW (ref 1.7–2.4)

## 2020-01-25 LAB — CBC WITH DIFFERENTIAL/PLATELET
Abs Immature Granulocytes: 0.04 10*3/uL (ref 0.00–0.07)
Basophils Absolute: 0 10*3/uL (ref 0.0–0.1)
Basophils Relative: 0 %
Eosinophils Absolute: 0 10*3/uL (ref 0.0–0.5)
Eosinophils Relative: 0 %
HCT: 33.2 % — ABNORMAL LOW (ref 36.0–46.0)
Hemoglobin: 11.1 g/dL — ABNORMAL LOW (ref 12.0–15.0)
Immature Granulocytes: 0 %
Lymphocytes Relative: 15 %
Lymphs Abs: 1.5 10*3/uL (ref 0.7–4.0)
MCH: 31.1 pg (ref 26.0–34.0)
MCHC: 33.4 g/dL (ref 30.0–36.0)
MCV: 93 fL (ref 80.0–100.0)
Monocytes Absolute: 0.8 10*3/uL (ref 0.1–1.0)
Monocytes Relative: 8 %
Neutro Abs: 7.7 10*3/uL (ref 1.7–7.7)
Neutrophils Relative %: 77 %
Platelets: 151 10*3/uL (ref 150–400)
RBC: 3.57 MIL/uL — ABNORMAL LOW (ref 3.87–5.11)
RDW: 12.4 % (ref 11.5–15.5)
WBC: 10 10*3/uL (ref 4.0–10.5)
nRBC: 0 % (ref 0.0–0.2)

## 2020-01-25 LAB — COMPREHENSIVE METABOLIC PANEL
ALT: 27 U/L (ref 0–44)
AST: 46 U/L — ABNORMAL HIGH (ref 15–41)
Albumin: 2.4 g/dL — ABNORMAL LOW (ref 3.5–5.0)
Alkaline Phosphatase: 30 U/L — ABNORMAL LOW (ref 38–126)
Anion gap: 6 (ref 5–15)
BUN: 13 mg/dL (ref 8–23)
CO2: 28 mmol/L (ref 22–32)
Calcium: 8.2 mg/dL — ABNORMAL LOW (ref 8.9–10.3)
Chloride: 104 mmol/L (ref 98–111)
Creatinine, Ser: 0.67 mg/dL (ref 0.44–1.00)
GFR calc Af Amer: >60 mL/min (ref >60)
GFR calc non Af Amer: >60 mL/min (ref >60)
Glucose, Bld: 97 mg/dL (ref 70–99)
Potassium: 3.7 mmol/L (ref 3.5–5.1)
Sodium: 138 mmol/L (ref 135–145)
Total Bilirubin: 0.6 mg/dL (ref 0.3–1.2)
Total Protein: 5 g/dL — ABNORMAL LOW (ref 6.5–8.1)

## 2020-01-25 LAB — URINE CULTURE: Culture: 30000 — AB

## 2020-01-25 LAB — CK: Total CK: 519 U/L — ABNORMAL HIGH (ref 38–234)

## 2020-01-25 MED ORDER — POLYETHYLENE GLYCOL 3350 17 G PO PACK: 17.0000 g | PACK | Freq: Every day | ORAL | 0 refills | Status: DC

## 2020-01-25 MED ORDER — POLYETHYLENE GLYCOL 3350 17 G PO PACK
17.0000 g | PACK | Freq: Every day | ORAL | Status: DC
  Administered 2020-01-25: 17 g via ORAL
  Filled 2020-01-25: qty 1

## 2020-01-25 MED ORDER — MAGNESIUM SULFATE 4 GM/100ML IV SOLN
4.0000 g | Freq: Once | INTRAVENOUS | Status: AC
  Administered 2020-01-25: 4 g via INTRAVENOUS
  Filled 2020-01-25: qty 100

## 2020-01-25 NOTE — Progress Notes (Signed)
Discharge instructions given to pt and son.  Many questions answered and MD called to speak with son as well.  IV access removed.

## 2020-01-25 NOTE — Plan of Care (Signed)
  Problem: Activity: Goal: Risk for activity intolerance will decrease Outcome: Progressing   Problem: Nutrition: Goal: Adequate nutrition will be maintained Outcome: Progressing   Problem: Pain Managment: Goal: General experience of comfort will improve Outcome: Progressing   Problem: Skin Integrity: Goal: Risk for impaired skin integrity will decrease Outcome: Progressing   

## 2020-01-25 NOTE — TOC Transition Note (Signed)
Transition of Care [facility]) - CM/SW Discharge Note   Patient Details  Name: Catherine Alexander MRN: [id number] Date of Birth: 1950/07/06  [facility] CM/SW Contact:  Pollie Friar, RN Phone Number: 01/25/2020, 3:44 PM   Clinical Narrative:    Pt discharging home with self care. Appts for PCP on AVS.  CM has left son a voicemail about d/c. Son to be back to hospital around 4:30 pm.  No new medications that TOC needs to assist with.  Son to provide transport home.   Final next level of care: Home/Self Care Barriers to Discharge: Inadequate or no insurance, Barriers Unresolved (comment)   Patient Goals and CMS Choice     Choice offered to / list presented to : Adult Children (son)  Discharge Placement                       Discharge Plan and Services   Discharge Planning Services: CM Consult                                 Social Determinants of Health (SDOH) Interventions     Readmission Risk Interventions No flowsheet data found.

## 2020-01-25 NOTE — Discharge Summary (Addendum)
Physician Discharge Summary  Luria Rosario XTK:[id number] DOB: [birth year] DOA: 01/23/2020  PCP: No primary care provider on file.  Admit date: 01/23/2020 Discharge date: 01/25/2020  Recommendations for Outpatient Follow-up:  1. Discharge to home under the 24/7 supervision of the patient's son and his family for the patient's safety. 2. Follow up with PCP. Keep appointment arranged below.   Follow-up [facility] Follow up.   Why: Please use this location for you pharmacy needs. They will assist with the cost of medications. Contact information: [place] [phone] [facility] on 03/11/2020.   Why: Your appt is at 8:30 am. Please arrive early and bring a picture ID and your current medications.  Contact information: [place] [phone]             Discharge Diagnoses: Principal diagnosis is #1 1. Dehydration 2. Rash - likely due to exposure to the elements. 3. Dementia - likely due to parkinson's 4. Parkinson's Disease 5. Acute subdural hematoma - clinically undertermined whether traumatic or non-traumatic due to patient elopement prior to admission.  Discharge Condition: Fair  Disposition: To home under the 24/7 supervision of the patient's son and family for the patient's safety.  Diet recommendation: Regular.  There were no vitals filed for this visit.  History of present illness:  69 year old [place] speaking female with past medical history of advanced dementia, Parkinson disease who presents to [facility] emergency department, brought in by EMS after the patient had run away from home, being found behind a neighbor's house sitting under a bush.  Patient is an extremely poor historian secondary to advanced dementia which is further complicated by language  barrier.  I have attempted to contact the son to obtain further history but have been unsuccessful as he is not answering the phone.  Therefore the majority of this history has been obtained from discussions with the emergency department staff/documentation.  Patient apparently escaped from her home at lunch on Friday.  Family could not locate her until approximately 4:30 PM the following day on Saturday.  Patient had likely been wandering in the woods and was found behind a neighbor's house sitting under a bush.  Upon being found, patient was lethargic and not at baseline and therefore was brought in by EMS for evaluation.  Upon evaluation by the emergency department staff, CT imaging of the head revealed small to moderate sized subdural hemorrhage of the left frontal and temporal lobes.  Case was discussed with Dr. Trenton Gammon with orthopedic surgery who recommended observation for at least 6 hours and repeat CT imaging with discharge home if there was no change.  Patient was also found to be clinically dehydrated with borderline low blood pressures, bradycardia and somewhat elevated creatine kinase of 951.  Hospitalist group was then called to assess the patient for admission to the hospital.  Hospital Course:  The patient was admitted to a telemetry bed. She was given IV fluids. She was found to be constipated. This was resolved with miralax. Her dehydration has resolved. I have discussed the patient's safety with her son. He understands that she is not to be left alone for her safety. She will be discharged to home with him today.  Today's assessment: S: The patient is resting comfortably. No new complaints. O: Vitals:  Vitals:   01/25/20 0814 01/25/20 0809  BP: (!) 101/54 (!) 108/53  Pulse: 60 69  Resp: 15 20  Temp: 97.7 F (36.5 C) 98 F (36.7 C)  SpO2: 100% 100%   Exam:  Constitutional:  . The patient is awake, alert, and agitated. She speaks only [place] in a dialect that is not  supported by video interpretor. Case management is also at bedside. The patient seems to need to use the toilet. Nursing has been alerted. Respiratory:  . No increased work of breathing. . No wheezes, rales, or rhonchi . No tactile fremitus Cardiovascular:  . Regular rate and rhythm . No murmurs, ectopy, or gallups. . No lateral PMI. No thrills. Abdomen:  . Abdomen is soft, non-tender, non-distended . No hernias, masses, or organomegaly . Normoactive bowel sounds.  Musculoskeletal:  . No cyanosis, clubbing, or edema Skin:  . No rashes, lesions, ulcers . palpation of skin: no induration or nodules . Petechial rash on the patient's hands and ankles. Neurologic:  . Patient is moving all extremities. She is unable to cooperate with neurological exam. Psychiatric:  . Patient is agitated. She is unable to cooperate with exam.  Discharge Instructions  Discharge Instructions    Activity as tolerated - No restrictions   Complete by: As directed    Always with assistance.   Ambulatory referral to Neurology   Complete by: As directed    An appointment is requested in approximately: 2 weeks   Call MD for:  difficulty breathing, headache or visual disturbances   Complete by: As directed    Call MD for:  persistant nausea and vomiting   Complete by: As directed    Call MD for:  severe uncontrolled pain   Complete by: As directed    Diet general   Complete by: As directed    Discharge instructions   Complete by: As directed    Discharge to home under the 24/7 supervision of patient's son and family for the patient's safety.     Allergies as of 01/25/2020   No Known Allergies     Medication List    TAKE these medications   polyethylene glycol 17 g packet Commonly known as: MIRALAX / GLYCOLAX Take 17 g by mouth daily. Start taking on: January 26, 2020      No Known Allergies  The results of significant diagnostics from this hospitalization (including imaging, microbiology,  ancillary and laboratory) are listed below for reference.    Significant Diagnostic Studies: DG Chest 1 View  Result Date: 01/24/2020 CLINICAL DATA:  Concern for pneumonia. EXAM: CHEST  1 VIEW COMPARISON:  None FINDINGS: Heart size appears normal. There is no pleural effusion or edema. Lungs are hyperinflated with coarsened interstitial markings noted bilaterally. Pulmonary nodule within the left lower lobe measures 1.2 cm. Indeterminate. No airspace consolidation. IMPRESSION: 1. No acute cardiopulmonary abnormalities. 2. Left lower lobe pulmonary nodule. Recommend further evaluation with nonemergent CT of the chest. Electronically Signed   By: Kerby Moors M.D.   On: 01/24/2020 07:24   CT Head Wo Contrast  Result Date: 01/24/2020 CLINICAL DATA:  69 year old female with follow-up subdural hemorrhage. EXAM: CT HEAD WITHOUT CONTRAST TECHNIQUE: Contiguous axial images were obtained from the base of the skull through the vertex without intravenous contrast. COMPARISON:  Head CT dated 01/23/2020. FINDINGS: Brain: No significant interval change in the subdural hemorrhage over the left frontal and temporal lobes compared to the earlier CT. Small posterior parafalcine subdural hemorrhage appears relatively similar to prior CT. No new bleed. Stable left hemispheric subdural hygroma.  There is mild age-related atrophy and chronic microvascular ischemic changes. There is no mass effect or midline shift. Vascular: No hyperdense vessel or unexpected calcification. Skull: Normal. Negative for fracture or focal lesion. Sinuses/Orbits: Mild mucoperiosteal thickening of paranasal sinuses. The mastoid air cells are clear. No air-fluid level. Other: None IMPRESSION: 1. No significant interval change in the size of the left frontotemporal or posterior para falcine subdural hemorrhages compared to the earlier CT. No new bleed. Continued follow-up recommended. 2. Stable left hemispheric subdural hygroma. 3. Mild age-related  atrophy and chronic microvascular ischemic changes. Electronically Signed   By: Anner Crete M.D.   On: 01/24/2020 00:54   CT Head Wo Contrast  Result Date: 01/23/2020 CLINICAL DATA:  Head and neck trauma with pain. EXAM: CT HEAD WITHOUT CONTRAST CT CERVICAL SPINE WITHOUT CONTRAST TECHNIQUE: Multidetector CT imaging of the head and cervical spine was performed following the standard protocol without intravenous contrast. Multiplanar CT image reconstructions of the cervical spine were also generated. COMPARISON:  None. FINDINGS: CT HEAD FINDINGS Brain: Small to moderate volume acute subdural blood products overlie the left frontal and temporal lobes. A subdural hygroma overlies the left cerebral convexity and may reflect chronic subdural hemorrhage. There is no significant associated mass effect. No evidence of acute infarction, hydrocephalus, or midline shift. There is mild cerebral volume loss with associated ex vacuo dilatation. Vascular: There are vascular calcifications in the carotid siphons. Skull: Normal. Negative for fracture or focal lesion. Sinuses/Orbits: No acute finding. Other: None. CT CERVICAL SPINE FINDINGS Alignment: Normal. Skull base and vertebrae: No acute fracture. No primary bone lesion or focal pathologic process. Soft tissues and spinal canal: No prevertebral fluid or swelling. No visible canal hematoma. Disc levels:  Mild degenerative changes are noted. Upper chest: Negative. Other: Biapical pleural scarring is partially imaged. IMPRESSION: 1. Small to moderate volume acute subdural hemorrhage overlying the left frontal and temporal lobes. No significant associated mass effect or midline shift. 2. 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 3. No acute fracture or subluxation of the cervical spine. These results were called by telephone at the time of interpretation on 01/23/2020 at 7:22 pm to provider [facility] , who  verbally acknowledged these results. Electronically Signed   By: Zerita Boers M.D.   On: 01/23/2020 19:22   CT Cervical Spine Wo Contrast  Result Date: 01/23/2020 CLINICAL DATA:  Head and neck trauma with pain. EXAM: CT HEAD WITHOUT CONTRAST CT CERVICAL SPINE WITHOUT CONTRAST TECHNIQUE: Multidetector CT imaging of the head and cervical spine was performed following the standard protocol without intravenous contrast. Multiplanar CT image reconstructions of the cervical spine were also generated. COMPARISON:  None. FINDINGS: CT HEAD FINDINGS Brain: Small to moderate volume acute subdural blood products overlie the left frontal and temporal lobes. A subdural hygroma overlies the left cerebral convexity and may reflect chronic subdural hemorrhage. There is no significant associated mass effect. No evidence of acute infarction, hydrocephalus, or midline shift. There is mild cerebral volume loss with associated ex vacuo dilatation. Vascular: There are vascular calcifications in the carotid siphons. Skull: Normal. Negative for fracture or focal lesion. Sinuses/Orbits: No acute finding. Other: None. CT CERVICAL SPINE FINDINGS Alignment: Normal. Skull base and vertebrae: No acute fracture. No primary bone lesion or focal pathologic process. Soft tissues and spinal canal: No prevertebral fluid or swelling. No visible canal hematoma. Disc levels:  Mild degenerative changes are noted. Upper chest: Negative. Other: Biapical pleural scarring is partially imaged.  IMPRESSION: 1. Small to moderate volume acute subdural hemorrhage overlying the left frontal and temporal lobes. No significant associated mass effect or midline shift. 2. 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 3. No acute fracture or subluxation of the cervical spine. These results were called by telephone at the time of interpretation on 01/23/2020 at 7:22 pm to provider [facility] , who verbally  acknowledged these results. Electronically Signed   By: Zerita Boers M.D.   On: 01/23/2020 19:22   DG Knee Complete 4 Views Left  Result Date: 01/23/2020 CLINICAL DATA:  Fall with knee pain EXAM: LEFT KNEE - COMPLETE 4+ VIEW COMPARISON:  None. FINDINGS: No evidence of fracture, dislocation, or joint effusion. No evidence of arthropathy or other focal bone abnormality. Soft tissues are unremarkable. IMPRESSION: Negative. Electronically Signed   By: Zerita Boers M.D.   On: 01/23/2020 18:40   DG Knee Complete 4 Views Right  Result Date: 01/23/2020 CLINICAL DATA:  Fall with knee pain EXAM: RIGHT KNEE - COMPLETE 4+ VIEW COMPARISON:  None. FINDINGS: No evidence of fracture, dislocation, or joint effusion. No evidence of arthropathy or other focal bone abnormality. Soft tissues are unremarkable. IMPRESSION: Negative. Electronically Signed   By: Zerita Boers M.D.   On: 01/23/2020 18:41   DG Foot Complete Left  Result Date: 01/23/2020 CLINICAL DATA:  Fall with foot pain. EXAM: LEFT FOOT - COMPLETE 3+ VIEW COMPARISON:  None. FINDINGS: There is no evidence of fracture or dislocation. There is no evidence of arthropathy or other focal bone abnormality. Soft tissues are unremarkable. IMPRESSION: Negative. Electronically Signed   By: Zerita Boers M.D.   On: 01/23/2020 18:42    Microbiology: Recent Results (from the past 240 hour(s))  Urine culture     Status: Abnormal   Collection Time: 01/23/20  5:53 PM   Specimen: Urine, Random  Result Value Ref Range Status   Specimen Description URINE, RANDOM  Final   Special Requests   Final    NONE Performed at [facility], [address] [place], [place] [address]    Culture 30,000 COLONIES/mL ESCHERICHIA COLI (A)  Final   Report Status 01/25/2020 FINAL  Final   Organism ID, Bacteria ESCHERICHIA COLI (A)  Final      Susceptibility   Escherichia coli - MIC*    AMPICILLIN <=2 SENSITIVE Sensitive     CEFAZOLIN <=4 SENSITIVE Sensitive      CEFTRIAXONE <=0.25 SENSITIVE Sensitive     CIPROFLOXACIN <=0.25 SENSITIVE Sensitive     GENTAMICIN <=1 SENSITIVE Sensitive     IMIPENEM <=0.25 SENSITIVE Sensitive     NITROFURANTOIN <=16 SENSITIVE Sensitive     TRIMETH/SULFA <=20 SENSITIVE Sensitive     AMPICILLIN/SULBACTAM <=2 SENSITIVE Sensitive     PIP/TAZO <=4 SENSITIVE Sensitive     * 30,000 COLONIES/mL ESCHERICHIA COLI  SARS Coronavirus 2 by RT PCR (hospital order, performed in [facility] hospital lab) Nasopharyngeal Nasopharyngeal Swab     Status: None   Collection Time: 01/23/20  8:08 PM   Specimen: Nasopharyngeal Swab  Result Value Ref Range Status   SARS Coronavirus 2 NEGATIVE NEGATIVE Final    Comment: (NOTE) SARS-CoV-2 target nucleic acids are NOT DETECTED.  The SARS-CoV-2 RNA is generally detectable in upper and lower respiratory specimens during the acute phase of infection. The lowest concentration of SARS-CoV-2 viral copies this assay can detect is 250 copies / mL. A negative result does not preclude SARS-CoV-2 infection and should  not be used as the sole basis for treatment or other patient management decisions.  A negative result may occur with improper specimen collection / handling, submission of specimen other than nasopharyngeal swab, presence of viral mutation(s) within the areas targeted by this assay, and inadequate number of viral copies (<250 copies / mL). A negative result must be combined with clinical observations, patient history, and epidemiological information.  Fact Sheet for Patients:   StrictlyIdeas.no  Fact Sheet for Healthcare Providers: BankingDealers.co.za  This test is not yet approved or  cleared by the [place] FDA and has been authorized for detection and/or diagnosis of SARS-CoV-2 by FDA under an Emergency Use Authorization (EUA).  This EUA will remain in effect (meaning this test can be used) for the duration of the COVID-19  declaration under Section 564(b)(1) of the Act, 21 U.S.C. section 360bbb-3(b)(1), unless the authorization is terminated or revoked sooner.  Performed at [facility], [place] [address], [place], [place] [address]      Labs: Basic Metabolic Panel: Recent Labs  Lab 01/23/20 1902 01/24/20 0721 01/25/20 0145  NA 141 141 138  K 3.7 3.8 3.7  CL 104 109 104  CO2 25 22 28   GLUCOSE 81 63* 97  BUN 24* 20 13  CREATININE 1.05* 0.79 0.67  CALCIUM 9.2 8.5* 8.2*  MG  --  1.8 1.6*   Liver Function Tests: Recent Labs  Lab 01/23/20 1902 01/24/20 0721 01/25/20 0145  AST 60* 53* 46*  ALT 33 29 27  ALKPHOS 34* 33* 30*  BILITOT 1.5* 1.3* 0.6  PROT 6.9 6.2* 5.0*  ALBUMIN 3.5 3.0* 2.4*   No results for input(s): LIPASE, AMYLASE in the last 168 hours. No results for input(s): AMMONIA in the last 168 hours. CBC: Recent Labs  Lab 01/23/20 1902 01/24/20 0721 01/25/20 0145  WBC 14.3* 11.7* 10.0  NEUTROABS 12.8* 10.3* 7.7  HGB 12.9 12.3 11.1*  HCT 39.2 37.8 33.2*  MCV 94.0 95.2 93.0  PLT 164 151 151   Cardiac Enzymes: Recent Labs  Lab 01/23/20 1902 01/24/20 0721 01/25/20 0145  CKTOTAL 951* 619* 519*   BNP: BNP (last 3 results) No results for input(s): BNP in the last 8760 hours.  ProBNP (last 3 results) No results for input(s): PROBNP in the last 8760 hours.  CBG: No results for input(s): GLUCAP in the last 168 hours.  Principal Problem:   Dehydration Active Problems:   Elevated CK   Dementia with behavioral disturbance (HCC)   SDH (subdural hematoma) (HCC)   Parkinson disease (HCC)   Abnormal foot color   Time coordinating discharge: 38 minutes.  Signed:        Taylon Louison, DO [facility]  01/25/2020, 3:43 PM

## 2020-01-25 NOTE — TOC Initial Note (Signed)
Transition of Care [facility]) - Initial/Assessment Note    Patient Details  Name: Catherine Alexander MRN: [id number] Date of Birth: 24-Feb-1951  Transition of Care [facility]) CM/SW Contact:    Pollie Friar, RN Phone Number: 01/25/2020, 11:29 AM  Clinical Narrative:                 Pt is from [place] staying with her son and DIL. Son states his wife can work from home and can provide the needed supervision at home. They plan on using child safety locks or alarms on the doors to the outside to help prevent her getting out and lost again. Pt was mainly independent at home. The long term goal is to get her back to [place] in an ALF.  Pt without insurance and PCP. CM inquired with the son about medical care here. He was agreeable to one of the [facility]. Appt made at [facility] and information on the AVS.  Son to provide transport home when medically ready.   Expected Discharge Plan: Home/Self Care Barriers to Discharge: Inadequate or no insurance, Continued Medical Work up, Barriers Unresolved (comment)   Patient Goals and CMS Choice     Choice offered to / list presented to : Adult Children (son)  Expected Discharge Plan and Services Expected Discharge Plan: Home/Self Care   Discharge Planning Services: CM Consult   Living arrangements for the past 2 months: Single Family Home                                      Prior Living Arrangements/Services Living arrangements for the past 2 months: Single Family Home Lives with:: Adult Children (son and daughter in [employer]) Patient language and need for interpreter reviewed:: Yes (pt speak chinese)        Need for Family Participation in Patient Care: Yes (Comment) Care giver support system in place?: Yes (comment) (son states his wife can provide 24 hour supervision at home)   Criminal Activity/Legal Involvement Pertinent to Current Situation/Hospitalization: No - Comment as needed  Activities of Daily Living      Permission  Sought/Granted                  Emotional Assessment Appearance:: Appears stated age         Psych Involvement: No (comment)  Admission diagnosis:  Dehydration [E86.0] Pneumonia [J18.9] SDH (subdural hematoma) ([place]) [S06.5X9A] Contusion of left foot, initial encounter [S90.32XA] Contusion of right knee, initial encounter [S80.01XA] Contusion of left knee, initial encounter [S80.02XA] Patient Active Problem List   Diagnosis Date Noted  . Dehydration 01/24/2020  . Elevated CK 01/24/2020  . Dementia with behavioral disturbance ([place]) 01/24/2020  . SDH (subdural hematoma) ([place]) 01/24/2020  . Parkinson disease ([place]) 01/24/2020  . Abnormal foot color 01/24/2020   PCP:  No primary care provider on file. Pharmacy:   [facility], [address] [place] [address] Phone: [phone] Fax: [phone]     Social Determinants of Health (SDOH) Interventions    Readmission Risk Interventions No flowsheet data found.

## 2020-01-25 NOTE — Progress Notes (Signed)
Nutrition Brief Note  RD consulted for assessment of nutritional requirements/ status.  Wt Readings from Last 15 Encounters:  No data found for Wt   69 year old [place] speaking female with past medical history of advanced dementia, Parkinson disease who presents to [facility] emergency department, brought in by EMS after the patient had run away from home, being found behind a neighbor's house sitting under a bush.  Pt admitted with dehydration.   Pt sitting up in bed at time of visit, motioning to food items (water cup and fruit cup) sitting on window sill. RD provided and helped open containers for her. Also provided additional snack and drink.   Pt unable to provide hisotry secondary to dementia. No family at bedside at time of visit. Per RN, no chewing or swallowing difficulties noted. Observed pt consume fruit cup and juice without difficulty.   Nutrition-Focused physical exam completed. Findings are no fat depletion, no muscle depletion, and no edema.   Current diet order is regular, patient is consuming approximately 50-100% of meals at this time. Labs and medications reviewed.   No nutrition interventions warranted at this time. If nutrition issues arise, please consult RD.   Loistine Chance, RD, LDN, [place] Registered Dietitian II Certified Diabetes Care and Education Specialist Please refer to [facility] for RD and/or RD on-call/weekend/after hours pager

## 2020-03-11 ENCOUNTER — Other Ambulatory Visit: Payer: Self-pay

## 2020-03-11 ENCOUNTER — Ambulatory Visit (INDEPENDENT_AMBULATORY_CARE_PROVIDER_SITE_OTHER): Payer: Self-pay | Admitting: Primary Care

## 2020-03-11 ENCOUNTER — Encounter (INDEPENDENT_AMBULATORY_CARE_PROVIDER_SITE_OTHER): Payer: Self-pay | Admitting: Primary Care

## 2020-03-11 VITALS — BP 112/63 | HR 78 | Temp 97.2°F | Wt 81.4 lb

## 2020-03-11 DIAGNOSIS — R64 Cachexia: Secondary | ICD-10-CM

## 2020-03-11 DIAGNOSIS — R32 Unspecified urinary incontinence: Secondary | ICD-10-CM

## 2020-03-11 DIAGNOSIS — Z7689 Persons encountering health services in other specified circumstances: Secondary | ICD-10-CM

## 2020-03-11 DIAGNOSIS — F0391 Unspecified dementia with behavioral disturbance: Secondary | ICD-10-CM

## 2020-03-11 NOTE — Progress Notes (Signed)
Pt son believes she is in late stage of parkinson disease  She needs assistance with everything except walking She does not speak at all

## 2020-03-11 NOTE — Progress Notes (Signed)
New Patient Office Visit  Subjective:  Patient ID: Catherine Alexander, female    DOB: 08/01/1950  Age: 69 y.o. MRN: [id number]  CC:  Chief Complaint  Patient presents with  . New Patient (Initial Visit)    HPI Catherine Alexander is a 69 year old female who is [place] and does not speak [place].  Her son Catherine Alexander will speak on her behalf she presents for establishment of care.  She was brought here to the [place] from [place] after her husband died she has a history of dementia and Parkinson she is unable to express any verbal communication, she is incontinent of bowel and bladder, reviewing last notes recommended do not leave patient overall needs 24 hours 7 days a week..  Son is over whelm with medical bills and her care.  She has insurance in [place] and monthly but due to something regarding the [place] is unable to retrieve the help Catherine needs.  Son is requesting a neurology appointment.  Past Medical History:  Diagnosis Date  . Dementia with behavioral disturbance ([place]) 01/24/2020    History reviewed. No pertinent surgical history.  Family History  Problem Relation Age of Onset  . Heart disease Neg Hx     Social History   Socioeconomic History  . Marital status: Widowed    Spouse name: Not on file  . Number of children: Not on file  . Years of education: Not on file  . Highest education level: Not on file  Occupational History  . Not on file  Tobacco Use  . Smoking status: Never Smoker  . Smokeless tobacco: Never Used  Substance and Sexual Activity  . Alcohol use: Not on file  . Drug use: Not on file  . Sexual activity: Not on file  Other Topics Concern  . Not on file  Social History Narrative  . Not on file   Social Determinants of Health   Financial Resource Strain:   . Difficulty of Paying Living Expenses: Not on file  Food Insecurity:   . Worried About Charity fundraiser in the Last Year: Not on file  . Ran Out of Food in the Last Year: Not on file   Transportation Needs:   . Lack of Transportation (Medical): Not on file  . Lack of Transportation (Non-Medical): Not on file  Physical Activity:   . Days of Exercise per Week: Not on file  . Minutes of Exercise per Session: Not on file  Stress:   . Feeling of Stress : Not on file  Social Connections:   . Frequency of Communication with Friends and Family: Not on file  . Frequency of Social Gatherings with Friends and Family: Not on file  . Attends Religious Services: Not on file  . Active Member of Clubs or Organizations: Not on file  . Attends Archivist Meetings: Not on file  . Marital Status: Not on file  Intimate Partner Violence:   . Fear of Current or Ex-Partner: Not on file  . Emotionally Abused: Not on file  . Physically Abused: Not on file  . Sexually Abused: Not on file    ROS Review of Systems  Genitourinary: Positive for urgency.       Incontinent of bowel and urine   Psychiatric/Behavioral: Positive for behavioral problems and decreased concentration.  All other systems reviewed and are negative.   Objective:   Today's Vitals: BP 112/63 (BP Location: Right Arm, Patient Position: Sitting, Cuff Size: Small)   Pulse  78   Temp (!) 97.2 F (36.2 C) (Temporal)   Wt 81 lb 6.4 oz (36.9 kg)   SpO2 93%   Physical Exam Vitals reviewed.  Constitutional:      Appearance: She is ill-appearing.  HENT:     Head: Normocephalic.     Right Ear: External ear normal.     Left Ear: External ear normal.     Nose: Nose normal.  Cardiovascular:     Rate and Rhythm: Normal rate and regular rhythm.  Pulmonary:     Effort: Pulmonary effort is normal.     Breath sounds: Normal breath sounds.  Abdominal:     General: Bowel sounds are normal.  Musculoskeletal:     Cervical back: Normal range of motion.  Skin:    Coloration: Skin is jaundiced and pale.  Neurological:     Mental Status: She is disoriented.  Psychiatric:     Comments: Unable to communicate      Assessment & Plan:  Catherine Alexander was seen today for new patient (initial visit).  Diagnoses and all orders for this visit:  Encounter to establish care Juluis Mire, NP-C will be your  (PCP) she is mastered prepared . Able to diagnosed and treatment also  answer health concern as well as continuing care of varied medical conditions, not limited by cause, organ system, or diagnosis.   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 polyethylene glycol (MIRALAX / GLYCOLAX) 17 g packet Take 17 g by mouth daily.   No facility-administered encounter medications on file as of 03/11/2020.    Follow-up: Return in about 1 week (around 03/18/2020) for CSW first available.   Kerin Perna, NP

## 2020-03-15 ENCOUNTER — Ambulatory Visit (INDEPENDENT_AMBULATORY_CARE_PROVIDER_SITE_OTHER): Payer: Self-pay | Admitting: Licensed Clinical Social Worker

## 2020-03-15 ENCOUNTER — Other Ambulatory Visit: Payer: Self-pay

## 2020-03-15 DIAGNOSIS — F439 Reaction to severe stress, unspecified: Secondary | ICD-10-CM

## 2020-03-18 ENCOUNTER — Telehealth: Payer: Self-pay | Admitting: Licensed Clinical Social Worker

## 2020-03-18 NOTE — Telephone Encounter (Signed)
LCSW placed call to [facility] to inquire about pt's eligibility for services. Intake Coordinator, Delcie Roch, reported that pt may not be eligible for medicaid due to pt not being a [place] Citizen. She encouraged family to contact DSS to obtain additional information.   Call placed to patient's son, HE to inform him that a Legal Aid referral has been submitted and LCSW has reached out to Financial Counselor regarding eligibility for [employer] to assist patient with medical coverage to meet with Neurologist. Message encouraged him to return call at his earliest convenience.

## 2020-04-02 ENCOUNTER — Emergency Department (HOSPITAL_COMMUNITY): Payer: Self-pay

## 2020-04-02 ENCOUNTER — Inpatient Hospital Stay (HOSPITAL_COMMUNITY)
Admission: EM | Admit: 2020-04-02 | Discharge: 2020-04-13 | DRG: 208 | Disposition: A | Discharge status: Hospice | Payer: Self-pay | Attending: Internal Medicine | Admitting: Internal Medicine

## 2020-04-02 ENCOUNTER — Other Ambulatory Visit: Payer: Self-pay

## 2020-04-02 ENCOUNTER — Inpatient Hospital Stay (HOSPITAL_COMMUNITY): Payer: Self-pay

## 2020-04-02 ENCOUNTER — Encounter (HOSPITAL_COMMUNITY): Payer: Self-pay | Admitting: Emergency Medicine

## 2020-04-02 DIAGNOSIS — G709 Myoneural disorder, unspecified: Secondary | ICD-10-CM | POA: Diagnosis present

## 2020-04-02 DIAGNOSIS — J9600 Acute respiratory failure, unspecified whether with hypoxia or hypercapnia: Secondary | ICD-10-CM

## 2020-04-02 DIAGNOSIS — R54 Age-related physical debility: Secondary | ICD-10-CM | POA: Diagnosis present

## 2020-04-02 DIAGNOSIS — J9602 Acute respiratory failure with hypercapnia: Secondary | ICD-10-CM | POA: Diagnosis present

## 2020-04-02 DIAGNOSIS — J9601 Acute respiratory failure with hypoxia: Secondary | ICD-10-CM | POA: Diagnosis present

## 2020-04-02 DIAGNOSIS — E860 Dehydration: Secondary | ICD-10-CM | POA: Diagnosis present

## 2020-04-02 DIAGNOSIS — Z4659 Encounter for fitting and adjustment of other gastrointestinal appliance and device: Secondary | ICD-10-CM

## 2020-04-02 DIAGNOSIS — R64 Cachexia: Secondary | ICD-10-CM | POA: Diagnosis present

## 2020-04-02 DIAGNOSIS — E43 Unspecified severe protein-calorie malnutrition: Secondary | ICD-10-CM | POA: Insufficient documentation

## 2020-04-02 DIAGNOSIS — E876 Hypokalemia: Secondary | ICD-10-CM | POA: Diagnosis present

## 2020-04-02 DIAGNOSIS — I824Z2 Acute embolism and thrombosis of unspecified deep veins of left distal lower extremity: Secondary | ICD-10-CM | POA: Diagnosis present

## 2020-04-02 DIAGNOSIS — D6959 Other secondary thrombocytopenia: Secondary | ICD-10-CM | POA: Diagnosis present

## 2020-04-02 DIAGNOSIS — R627 Adult failure to thrive: Secondary | ICD-10-CM | POA: Diagnosis present

## 2020-04-02 DIAGNOSIS — F0281 Dementia in other diseases classified elsewhere with behavioral disturbance: Secondary | ICD-10-CM | POA: Diagnosis present

## 2020-04-02 DIAGNOSIS — G9341 Metabolic encephalopathy: Secondary | ICD-10-CM | POA: Diagnosis present

## 2020-04-02 DIAGNOSIS — T17908A Unspecified foreign body in respiratory tract, part unspecified causing other injury, initial encounter: Secondary | ICD-10-CM

## 2020-04-02 DIAGNOSIS — J9690 Respiratory failure, unspecified, unspecified whether with hypoxia or hypercapnia: Secondary | ICD-10-CM

## 2020-04-02 DIAGNOSIS — Z0189 Encounter for other specified special examinations: Secondary | ICD-10-CM

## 2020-04-02 DIAGNOSIS — R32 Unspecified urinary incontinence: Secondary | ICD-10-CM | POA: Diagnosis present

## 2020-04-02 DIAGNOSIS — G20 Parkinson's disease: Secondary | ICD-10-CM | POA: Diagnosis present

## 2020-04-02 DIAGNOSIS — N179 Acute kidney failure, unspecified: Secondary | ICD-10-CM | POA: Diagnosis present

## 2020-04-02 DIAGNOSIS — J690 Pneumonitis due to inhalation of food and vomit: Principal | ICD-10-CM | POA: Diagnosis present

## 2020-04-02 DIAGNOSIS — I959 Hypotension, unspecified: Secondary | ICD-10-CM | POA: Diagnosis not present

## 2020-04-02 DIAGNOSIS — Z681 Body mass index (BMI) 19 or less, adult: Secondary | ICD-10-CM

## 2020-04-02 DIAGNOSIS — R1310 Dysphagia, unspecified: Secondary | ICD-10-CM | POA: Diagnosis present

## 2020-04-02 DIAGNOSIS — E870 Hyperosmolality and hypernatremia: Secondary | ICD-10-CM | POA: Diagnosis present

## 2020-04-02 DIAGNOSIS — Z20822 Contact with and (suspected) exposure to covid-19: Secondary | ICD-10-CM | POA: Diagnosis present

## 2020-04-02 DIAGNOSIS — Z66 Do not resuscitate: Secondary | ICD-10-CM | POA: Diagnosis present

## 2020-04-02 LAB — PROTIME-INR
INR: 4.2 (ref 0.8–1.2)
Prothrombin Time: 39.3 seconds — ABNORMAL HIGH (ref 11.4–15.2)

## 2020-04-02 LAB — CK: Total CK: 89 U/L (ref 38–234)

## 2020-04-02 LAB — URINALYSIS, ROUTINE W REFLEX MICROSCOPIC
Bilirubin Urine: NEGATIVE
Glucose, UA: NEGATIVE mg/dL
Ketones, ur: NEGATIVE mg/dL
Leukocytes,Ua: NEGATIVE
Nitrite: NEGATIVE
Protein, ur: 30 mg/dL — AB
Specific Gravity, Urine: 1.02 (ref 1.005–1.030)
pH: 5 (ref 5.0–8.0)

## 2020-04-02 LAB — COMPREHENSIVE METABOLIC PANEL
ALT: 10 U/L (ref 0–44)
AST: 13 U/L — ABNORMAL LOW (ref 15–41)
Albumin: 1.1 g/dL — ABNORMAL LOW (ref 3.5–5.0)
Alkaline Phosphatase: 17 U/L — ABNORMAL LOW (ref 38–126)
BUN: 47 mg/dL — ABNORMAL HIGH (ref 8–23)
CO2: 11 mmol/L — ABNORMAL LOW (ref 22–32)
Calcium: <4 mg/dL — CL (ref 8.9–10.3)
Chloride: >130 mmol/L (ref 98–111)
Creatinine, Ser: 0.71 mg/dL (ref 0.44–1.00)
GFR, Estimated: >60 mL/min (ref >60)
Glucose, Bld: 68 mg/dL — ABNORMAL LOW (ref 70–99)
Potassium: <2 mmol/L — CL (ref 3.5–5.1)
Sodium: 157 mmol/L — ABNORMAL HIGH (ref 135–145)
Total Bilirubin: 0.6 mg/dL (ref 0.3–1.2)
Total Protein: <3 g/dL — ABNORMAL LOW (ref 6.5–8.1)

## 2020-04-02 LAB — BLOOD GAS, ARTERIAL
Acid-base deficit: 4.2 mmol/L — ABNORMAL HIGH (ref 0.0–2.0)
Bicarbonate: 19.7 mmol/L — ABNORMAL LOW (ref 20.0–28.0)
Drawn by: 560031
FIO2: 100
MECHVT: 430 mL
O2 Saturation: 99.3 %
PEEP: 5 cmH2O
Patient temperature: 98.8
RATE: 14 resp/min
pCO2 arterial: 34.3 mmHg (ref 32.0–48.0)
pH, Arterial: 7.377 (ref 7.350–7.450)
pO2, Arterial: 539 mmHg — ABNORMAL HIGH (ref 83.0–108.0)

## 2020-04-02 LAB — GLUCOSE, CAPILLARY: Glucose-Capillary: 104 mg/dL — ABNORMAL HIGH (ref 70–99)

## 2020-04-02 LAB — CBC WITH DIFFERENTIAL/PLATELET
Abs Immature Granulocytes: 0.09 10*3/uL — ABNORMAL HIGH (ref 0.00–0.07)
Basophils Absolute: 0 10*3/uL (ref 0.0–0.1)
Basophils Relative: 0 %
Eosinophils Absolute: 0 10*3/uL (ref 0.0–0.5)
Eosinophils Relative: 0 %
HCT: 49.8 % — ABNORMAL HIGH (ref 36.0–46.0)
Hemoglobin: 14.9 g/dL (ref 12.0–15.0)
Immature Granulocytes: 1 %
Lymphocytes Relative: 8 %
Lymphs Abs: 1 10*3/uL (ref 0.7–4.0)
MCH: 31 pg (ref 26.0–34.0)
MCHC: 29.9 g/dL — ABNORMAL LOW (ref 30.0–36.0)
MCV: 103.8 fL — ABNORMAL HIGH (ref 80.0–100.0)
Monocytes Absolute: 0.4 10*3/uL (ref 0.1–1.0)
Monocytes Relative: 3 %
Neutro Abs: 10.9 10*3/uL — ABNORMAL HIGH (ref 1.7–7.7)
Neutrophils Relative %: 88 %
Platelets: 130 10*3/uL — ABNORMAL LOW (ref 150–400)
RBC: 4.8 MIL/uL (ref 3.87–5.11)
RDW: 14.8 % (ref 11.5–15.5)
WBC: 12.3 10*3/uL — ABNORMAL HIGH (ref 4.0–10.5)
nRBC: 2.8 % — ABNORMAL HIGH (ref 0.0–0.2)

## 2020-04-02 LAB — RAPID URINE DRUG SCREEN, HOSP PERFORMED
Amphetamines: NOT DETECTED
Barbiturates: NOT DETECTED
Benzodiazepines: NOT DETECTED
Cocaine: NOT DETECTED
Opiates: NOT DETECTED
Tetrahydrocannabinol: NOT DETECTED

## 2020-04-02 LAB — LACTIC ACID, PLASMA
Lactic Acid, Venous: 2.9 mmol/L (ref 0.5–1.9)
Lactic Acid, Venous: 2.9 mmol/L (ref 0.5–1.9)

## 2020-04-02 LAB — TROPONIN I (HIGH SENSITIVITY): Troponin I (High Sensitivity): 49 ng/L — ABNORMAL HIGH (ref <18)

## 2020-04-02 LAB — RESPIRATORY PANEL BY RT PCR (FLU A&B, COVID)
Influenza A by PCR: NEGATIVE
Influenza B by PCR: NEGATIVE
SARS Coronavirus 2 by RT PCR: NEGATIVE

## 2020-04-02 LAB — CBG MONITORING, ED: Glucose-Capillary: 134 mg/dL — ABNORMAL HIGH (ref 70–99)

## 2020-04-02 LAB — MAGNESIUM: Magnesium: 3 mg/dL — ABNORMAL HIGH (ref 1.7–2.4)

## 2020-04-02 LAB — PHOSPHORUS: Phosphorus: 5.6 mg/dL — ABNORMAL HIGH (ref 2.5–4.6)

## 2020-04-02 LAB — MRSA PCR SCREENING: MRSA by PCR: NEGATIVE

## 2020-04-02 MED ORDER — POLYETHYLENE GLYCOL 3350 17 G PO PACK: 17.0000 g | PACK | Freq: Every day | ORAL | Status: DC | PRN

## 2020-04-02 MED ORDER — ROCURONIUM BROMIDE 10 MG/ML (PF) SYRINGE
40.0000 mg | PREFILLED_SYRINGE | Freq: Once | INTRAVENOUS | Status: AC
  Administered 2020-04-02: 40 mg via INTRAVENOUS
  Filled 2020-04-02: qty 10

## 2020-04-02 MED ORDER — KCL IN DEXTROSE-NACL 20-5-0.2 MEQ/L-%-% IV SOLN
INTRAVENOUS | Status: DC
  Filled 2020-04-02 (×10): qty 1000

## 2020-04-02 MED ORDER — CALCIUM GLUCONATE-NACL 1-0.675 GM/50ML-% IV SOLN
1.0000 g | Freq: Once | INTRAVENOUS | Status: AC
  Administered 2020-04-02: 1000 mg via INTRAVENOUS
  Filled 2020-04-02: qty 50

## 2020-04-02 MED ORDER — ONDANSETRON HCL 4 MG/2ML IJ SOLN: 4.0000 mg | Freq: Four times a day (QID) | INTRAMUSCULAR | Status: DC | PRN

## 2020-04-02 MED ORDER — FENTANYL CITRATE (PF) 100 MCG/2ML IJ SOLN
25.0000 ug | INTRAMUSCULAR | Status: DC | PRN
  Administered 2020-04-06: 50 ug via INTRAVENOUS
  Filled 2020-04-02: qty 2

## 2020-04-02 MED ORDER — FENTANYL CITRATE (PF) 100 MCG/2ML IJ SOLN: 25.0000 ug | INTRAMUSCULAR | Status: DC | PRN

## 2020-04-02 MED ORDER — DOCUSATE SODIUM 100 MG PO CAPS: 100.0000 mg | ORAL_CAPSULE | Freq: Two times a day (BID) | ORAL | Status: DC | PRN

## 2020-04-02 MED ORDER — MIDAZOLAM HCL 2 MG/2ML IJ SOLN
1.0000 mg | INTRAMUSCULAR | Status: DC | PRN
  Filled 2020-04-02: qty 2

## 2020-04-02 MED ORDER — SODIUM CHLORIDE 0.9 % IV BOLUS
1000.0000 mL | Freq: Once | INTRAVENOUS | Status: AC
  Administered 2020-04-02: 1000 mL via INTRAVENOUS

## 2020-04-02 MED ORDER — POTASSIUM CHLORIDE 10 MEQ/100ML IV SOLN
10.0000 meq | INTRAVENOUS | Status: AC
  Administered 2020-04-02 (×3): 10 meq via INTRAVENOUS
  Filled 2020-04-02 (×3): qty 100

## 2020-04-02 MED ORDER — MAGNESIUM SULFATE 2 GM/50ML IV SOLN
2.0000 g | Freq: Once | INTRAVENOUS | Status: DC
  Filled 2020-04-02: qty 50

## 2020-04-02 MED ORDER — FENTANYL CITRATE (PF) 100 MCG/2ML IJ SOLN
50.0000 ug | INTRAMUSCULAR | Status: DC | PRN
  Administered 2020-04-02 (×3): 50 ug via INTRAVENOUS
  Filled 2020-04-02: qty 2

## 2020-04-02 MED ORDER — POLYETHYLENE GLYCOL 3350 17 G PO PACK
17.0000 g | PACK | Freq: Every day | ORAL | Status: DC
  Administered 2020-04-03: 17 g via ORAL
  Filled 2020-04-02: qty 1

## 2020-04-02 MED ORDER — ORAL CARE MOUTH RINSE
15.0000 mL | OROMUCOSAL | Status: DC
  Administered 2020-04-02 – 2020-04-04 (×15): 15 mL via OROMUCOSAL

## 2020-04-02 MED ORDER — ETOMIDATE 2 MG/ML IV SOLN
10.0000 mg | Freq: Once | INTRAVENOUS | Status: AC
  Administered 2020-04-02: 10 mg via INTRAVENOUS
  Filled 2020-04-02: qty 10

## 2020-04-02 MED ORDER — DOCUSATE SODIUM 50 MG/5ML PO LIQD
100.0000 mg | Freq: Two times a day (BID) | ORAL | Status: DC
  Administered 2020-04-03 – 2020-04-09 (×10): 100 mg
  Filled 2020-04-02 (×13): qty 10

## 2020-04-02 MED ORDER — VITAL HIGH PROTEIN PO LIQD
1000.0000 mL | ORAL | Status: AC
  Administered 2020-04-02 – 2020-04-03 (×2): 1000 mL
  Filled 2020-04-02: qty 1000

## 2020-04-02 MED ORDER — DOCUSATE SODIUM 50 MG/5ML PO LIQD
100.0000 mg | Freq: Two times a day (BID) | ORAL | Status: DC
  Filled 2020-04-02 (×2): qty 10

## 2020-04-02 MED ORDER — CHLORHEXIDINE GLUCONATE 0.12% ORAL RINSE (MEDLINE KIT)
15.0000 mL | Freq: Two times a day (BID) | OROMUCOSAL | Status: DC
  Administered 2020-04-02 – 2020-04-04 (×4): 15 mL via OROMUCOSAL

## 2020-04-02 MED ORDER — CHLORHEXIDINE GLUCONATE CLOTH 2 % EX PADS
6.0000 | MEDICATED_PAD | Freq: Every day | CUTANEOUS | Status: DC
  Administered 2020-04-02 – 2020-04-05 (×4): 6 via TOPICAL

## 2020-04-02 MED ORDER — MIDAZOLAM HCL 2 MG/2ML IJ SOLN
1.0000 mg | INTRAMUSCULAR | Status: DC | PRN
  Administered 2020-04-02 – 2020-04-03 (×2): 1 mg via INTRAVENOUS

## 2020-04-02 MED ORDER — POTASSIUM CHLORIDE 20 MEQ/15ML (10%) PO SOLN
40.0000 meq | Freq: Once | ORAL | Status: DC
  Filled 2020-04-02: qty 30

## 2020-04-02 MED ORDER — ACETAMINOPHEN 325 MG PO TABS
650.0000 mg | ORAL_TABLET | ORAL | Status: DC | PRN
  Administered 2020-04-03 – 2020-04-12 (×6): 650 mg via ORAL
  Filled 2020-04-02 (×7): qty 2

## 2020-04-02 MED ORDER — FAMOTIDINE IN NACL 20-0.9 MG/50ML-% IV SOLN
20.0000 mg | Freq: Two times a day (BID) | INTRAVENOUS | Status: DC
  Administered 2020-04-02: 20 mg via INTRAVENOUS
  Filled 2020-04-02: qty 50

## 2020-04-02 MED ORDER — FENTANYL CITRATE (PF) 100 MCG/2ML IJ SOLN
50.0000 ug | INTRAMUSCULAR | Status: DC | PRN
  Filled 2020-04-02: qty 2

## 2020-04-02 NOTE — Progress Notes (Signed)
Pt transported to and from CT with no complications noted. Vitals stable at this time, RT will continue to monitor.

## 2020-04-02 NOTE — ED Notes (Addendum)
Date and time results received: 04/02/20 6:08 PM  Test: INR  Critical Value: 4.2  Name of Provider Notified: McQuaid Orders Received? Or Actions Taken?: Orders Received - See Orders for details

## 2020-04-02 NOTE — ED Provider Notes (Signed)
[facility] DEPT Provider Note   CSN: [id number] Arrival date & time: 04/02/20  1617  [place]  History Chief Complaint  Patient presents with  . Failure To Thrive    Catherine Alexander is a 69 y.o. female.  HPI 69 year old female brought in by EMS for altered mental status and failure to thrive.  History is primarily from EMS as the patient is unresponsive and son does not answer the phone on a quick phone call.  The patient has been eating and drinking a lot less for the last week or so.  Found on the bed by EMS.  Son was able to provide history but the patient was altered and does not speak [place], severely limiting the history.  She was found to black findings on her lips. They found her to be hypotensive in the 70s on first arrival.  Started I/O and given 500 cc fluids and started on epi drip.  Pressure has come up.  Initial O2 sats were in the 50s which have come up primarily only with bagging.  She is breathing on her own but does not open her eyes, speak or follow commands.  Quick chart review shows she had a subdural managed non-op at the end of august. At that time she was a full code.   Past Medical History:  Diagnosis Date  . Dementia with behavioral disturbance ([place]) 01/24/2020    Patient Active Problem List   Diagnosis Date Noted  . Acute hypercapnic respiratory failure ([place]) 04/02/2020  . Dehydration 01/24/2020  . Elevated CK 01/24/2020  . Dementia with behavioral disturbance ([place]) 01/24/2020  . SDH (subdural hematoma) ([place]) 01/24/2020  . Parkinson disease ([place]) 01/24/2020  . Abnormal foot color 01/24/2020    History reviewed. No pertinent surgical history.   OB History   No obstetric history on file.     Family History  Problem Relation Age of Onset  . Heart disease Neg Hx     Social History   Tobacco Use  . Smoking status: Never Smoker  . Smokeless tobacco: Never Used  Substance Use Topics  . Alcohol  use: Not on file  . Drug use: Not on file    Home Medications Prior to Admission medications   Medication Sig Start Date End Date Taking? Authorizing Provider  polyethylene glycol (MIRALAX / GLYCOLAX) 17 g packet Take 17 g by mouth daily. 01/26/20   Swayze, Ava, DO    Allergies    Patient has no known allergies.  Review of Systems   Review of Systems  Unable to perform ROS: Patient unresponsive    Physical Exam Updated Vital Signs BP 122/67   Pulse 76   Temp (!) 92.3 F (33.5 C)   Resp 14   SpO2 100%   Physical Exam Vitals and nursing note reviewed.  Constitutional:      Appearance: She is well-developed. She is cachectic. She is ill-appearing. She is not diaphoretic.  HENT:     Head: Normocephalic and atraumatic.     Right Ear: External ear normal.     Left Ear: External ear normal.     Nose: Nose normal.  Eyes:     General:        Right eye: No discharge.        Left eye: No discharge.     Pupils: Pupils are equal, round, and reactive to light.  Cardiovascular:     Rate and Rhythm: Normal rate and regular rhythm.  Heart sounds: Normal heart sounds.  Pulmonary:     Comments: Poor respiratory effort. Currently being bagged for support Abdominal:     General: There is no distension.  Skin:    General: Skin is warm and dry.  Neurological:     Mental Status: She is lethargic.     Comments: Moves all 4 extremities to pain and sometimes spontaneously  Psychiatric:        Mood and Affect: Mood is not anxious.     ED Results / Procedures / Treatments   Labs (all labs ordered are listed, but only abnormal results are displayed) Labs Reviewed  COMPREHENSIVE METABOLIC PANEL - Abnormal; Notable for the following components:      Result Value   Sodium 157 (*)    Potassium <2.0 (*)    Chloride >130 (*)    CO2 11 (*)    Glucose, Bld 68 (*)    BUN 47 (*)    Calcium <4.0 (*)    Total Protein <3.0 (*)    Albumin 1.1 (*)    AST 13 (*)    Alkaline Phosphatase  17 (*)    All other components within normal limits  LACTIC ACID, PLASMA - Abnormal; Notable for the following components:   Lactic Acid, Venous 2.9 (*)    All other components within normal limits  PROTIME-INR - Abnormal; Notable for the following components:   Prothrombin Time 39.3 (*)    INR 4.2 (*)    All other components within normal limits  BLOOD GAS, ARTERIAL - Abnormal; Notable for the following components:   pO2, Arterial 539 (*)    Bicarbonate 19.7 (*)    Acid-base deficit 4.2 (*)    All other components within normal limits  CBG MONITORING, ED - Abnormal; Notable for the following components:   Glucose-Capillary 134 (*)    All other components within normal limits  TROPONIN I (HIGH SENSITIVITY) - Abnormal; Notable for the following components:   Troponin I (High Sensitivity) 49 (*)    All other components within normal limits  CULTURE, BLOOD (ROUTINE X 2)  CULTURE, BLOOD (ROUTINE X 2)  RESPIRATORY PANEL BY RT PCR (FLU A&B, COVID)  CULTURE, RESPIRATORY  CK  URINALYSIS, ROUTINE W REFLEX MICROSCOPIC  RAPID URINE DRUG SCREEN, HOSP PERFORMED  CBC WITH DIFFERENTIAL/PLATELET  MAGNESIUM  PHOSPHORUS  CORTISOL  CBC  BASIC METABOLIC PANEL  MAGNESIUM  PHOSPHORUS    EKG EKG Interpretation  Date/Time:  Saturday April 02 2020 16:52:44 EDT Ventricular Rate:  84 PR Interval:    QRS Duration: 83 QT Interval:  420 QTC Calculation: 497 R Axis:   60 Text Interpretation: Sinus rhythm Probable left atrial enlargement Probable LVH with secondary repol abnrm Borderline prolonged QT interval No old tracing to compare Confirmed by Sherwood Gambler [phone]) on 04/02/2020 5:12:34 PM   Radiology DG Chest Portable 1 View  Result Date: 04/02/2020 CLINICAL DATA:  Unresponsive EXAM: PORTABLE CHEST 1 VIEW COMPARISON:  January 24, 2020 FINDINGS: The heart size and mediastinal contours are within normal limits. Aortic knob calcification is seen. ETT is 2.5 cm above the level of the carina.  Both lungs are clear. There is hyperinflation of the lung zones. The visualized skeletal structures are unremarkable. Probable small hiatal hernia is noted. IMPRESSION: No active disease. Electronically Signed   By: Prudencio Pair M.D.   On: 04/02/2020 17:59    Procedures .Critical Care Performed by: Sherwood Gambler, MD Authorized by: Sherwood Gambler, MD   Critical care provider statement:  Critical care time (minutes):  50   Critical care time was exclusive of:  Separately billable procedures and treating other patients   Critical care was necessary to treat or prevent imminent or life-threatening deterioration of the following conditions:  CNS failure or compromise and respiratory failure   Critical care was time spent personally by me on the following activities:  Discussions with consultants, evaluation of patient's response to treatment, examination of patient, ordering and performing treatments and interventions, ordering and review of laboratory studies, ordering and review of radiographic studies, pulse oximetry, re-evaluation of patient's condition, obtaining history from patient or surrogate and review of old charts   (including critical care time)  Medications Ordered in ED Medications  docusate sodium (COLACE) capsule 100 mg (has no administration in time range)  polyethylene glycol (MIRALAX / GLYCOLAX) packet 17 g (has no administration in time range)  docusate (COLACE) 50 MG/5ML liquid 100 mg (has no administration in time range)  polyethylene glycol (MIRALAX / GLYCOLAX) packet 17 g (has no administration in time range)  feeding supplement (VITAL HIGH PROTEIN) liquid 1,000 mL (has no administration in time range)  dextrose 5 % and 0.2 % NaCl with KCl 20 mEq infusion (has no administration in time range)  acetaminophen (TYLENOL) tablet 650 mg (has no administration in time range)  ondansetron (ZOFRAN) injection 4 mg (has no administration in time range)  famotidine (PEPCID)  IVPB 20 mg premix (has no administration in time range)  fentaNYL (SUBLIMAZE) injection 25 mcg (has no administration in time range)  fentaNYL (SUBLIMAZE) injection 25-100 mcg (has no administration in time range)  midazolam (VERSED) injection 1 mg (has no administration in time range)  midazolam (VERSED) injection 1 mg (has no administration in time range)  potassium chloride 10 mEq in 100 mL IVPB (has no administration in time range)  magnesium sulfate IVPB 2 g 50 mL (has no administration in time range)  calcium gluconate 1 g/ 50 mL sodium chloride IVPB (has no administration in time range)  potassium chloride 20 MEQ/15ML (10%) solution 40 mEq (has no administration in time range)  sodium chloride 0.9 % bolus 1,000 mL (0 mLs Intravenous Stopped 04/02/20 1720)  etomidate (AMIDATE) injection 10 mg (10 mg Intravenous Given 04/02/20 1634)  rocuronium bromide 10 mg/mL (PF) syringe (40 mg Intravenous Given 04/02/20 1635)    ED Course  I have reviewed the triage vital signs and the nursing notes.  Pertinent labs & imaging results that were available during my care of the patient were reviewed by me and considered in my medical decision making (see chart for details).    MDM Rules/Calculators/A&P                          The patient was intubated for respiratory failure. She is breathing but not with good enough effort and was requiring BVM. See Sophia Caccavale's note for details. She was give fluids. She is no longer requiring pressors that EMS started. Chem 8 was obtained and is not crossing over but shows Na 170, Cr 1.8 (normal 0.6), Chloride >130. I talked with son and he indicates patient has basically stopped eating and drinking. He takes care of her but also goes to work in the day. He was informed of her critical illness. Given her history, my suspicion of infection is low. Head CT obtained, no recurrent bleed.   Labs finally came back and show severe hyponatremia and hypocalcemia. Will  give OG K, IV  K, IV calcium. Admit to ICU.  Final Clinical Impression(s) / ED Diagnoses Final diagnoses:  Acute respiratory failure, unspecified whether with hypoxia or hypercapnia ([place])  Acute kidney injury ([place])  Hypernatremia    Rx / DC Orders ED Discharge Orders    None       Sherwood Gambler, MD 04/02/20 [phone]

## 2020-04-02 NOTE — ED Notes (Signed)
Attempted to call report to 2W. No answer by staff.

## 2020-04-02 NOTE — ED Notes (Signed)
1630: Pt apneic and PA using BVM at this time.   1634: 10 mg Etomidate given  1635: 40 mg ROCC given   1636: PA intubating; positive color change and ausculation by MD

## 2020-04-02 NOTE — ED Triage Notes (Signed)
BIB EMS from home for FTT. Family stated the patient is "not doing well." Last PO intake was Tuesday and last output was Wednesday. Initial BP 74/30 and initial spo2 50% RA. EMS gave 500 cc fluids and epi at 4 mcg/min. IO in left tibia. Also reports black emesis.

## 2020-04-02 NOTE — Progress Notes (Signed)
eLink Physician-Brief Progress Note Patient Name: Catherine Alexander DOB: 10-17-1950 MRN: [id number]   Date of Service  04/02/2020  HPI/Events of Note  Patient admitted with altered mental status and acute respiratory failure requiring intubation, in the context of end stage Parkinson's disease with failure to thrive, patient was made DNR by Dr. [place] Bells in the ED after conversation with her son.  eICU Interventions  New Patient Evaluation completed.        Kerry Kass Nicha Hemann 04/02/2020, 8:24 PM

## 2020-04-02 NOTE — H&P (Signed)
 NAME:  Catherine Alexander, MRN:  [id number], DOB:  04/14/1951, LOS: 0 ADMISSION DATE:  04/02/2020, CONSULTATION DATE:  11/6 REFERRING MD:  [place], CHIEF COMPLAINT:  Unresponsive   Brief History   68 y/o female admitted from the [facility] emergency department after she was found unresponsive at home on 04/02/2020.  She has a history of advanced Parkinson's disease and has had poor p.o. intake for several weeks prior to admission  History of present illness   This is a 68-year-old female who has a known history of advanced Parkinson's dementia who was brought to the [facility] emergency department on 04/02/2020 in the setting of being found unresponsive.  Her son provides history because she was intubated upon my arrival.  He says that for the last several weeks she has been more weak, keeping to herself and has been refusing care.  She has refused food and drink on multiple times and is try to bring it to her.  She last saw her Parkinson's doctor about 2-1/2 weeks ago, at that time it was felt that she had advanced Parkinson's disease with little more which could be offered.  Over the last week he says she has becoming more weak, she has been angry with him at times when he has offered care.  She has had very little to eat or drink.  She has been wearing diapers for the last several months because she has been incontinent of urine which has been attributed to her advanced Parkinson's disease.  He says that she has been losing weight.  Today he found her to be minimally responsive with dried, dark emesis on her lips.  For this reason he called 911.  In the [facility] emergency department she was noted to be hypotensive, unresponsive, with agonal respirations so she was intubated.  Past Medical History  Advanced Parkinson's disease  Significant Hospital Events   November 6 admission  Consults:    Procedures:  November 6 endotracheal tube  Significant Diagnostic Tests:  November 6 CT  head>>>  Micro Data:  November 6 SARS-CoV-2>   Antimicrobials:  None  Interim history/subjective:    Objective   Blood pressure (!) 129/30, pulse 76, resp. rate 14, SpO2 100 %.    Vent Mode: PRVC FiO2 (%):  [100 %] 100 % Set Rate:  [14 bmp] 14 bmp Vt Set:  [330 mL-430 mL] 430 mL PEEP:  [5 cmH20] 5 cmH20 Plateau Pressure:  [10 cmH20] 10 cmH20  No intake or output data in the 24 hours ending 04/02/20 1721 There were no vitals filed for this visit.  Examination:  General: cachechtic In bed on vent HENT: NCAT ETT in place, temporal wasting PULM: CTA B, vent supported breathing CV: RRR, no mgr GI: scaphoid, BS+, soft, nontender, midline surgical scar well healed MSK: extremely diminished bulk, no bony abnormalities Neuro: sedated on vent    Resolved Hospital Problem list     Assessment & Plan:  Acute respiratory failure with hypoxemia due to generalized muscle weakness, possible aspiration Admit to ICU Chest x-ray Unasyn Ventilator associated pneumonia prevention protocol Full mechanical ventilatory support Daily wake-up assessment/spontaneous breathing trial  Possible aspiration pneumonitis: Unasyn Respiratory culture  Hypernatremia and acute kidney injury due to poor p.o. intake and volume depletion Receiving 1 L normal saline in ER Given very low weight, need to be careful with volume resuscitation: Start D5 half-normal saline with 20 mEq of potassium at 50 cc an hour Repeat be met in a.m. Place Foley Monitor urine   output  Malnutrition, severe: Start tube feeding but at very low, trickle rate Dietary consult in a.m. Monitor mag and Phos closely  Advanced Parkinson's disease Failure to thrive in an adult History of subdural hematoma Head CT  She has advanced neuromuscular weakness and very advanced malnutrition, there is only going to be so much we can do in this situation and the intervention of CPR in the event of a cardiac arrest would cause rib  fractures, pain and suffering with 0 chance of survival to hospital discharge.  I explained this to the patient's son and explained that her CODE STATUS would be DNR, he voiced understanding.    Best practice:  Diet: Tube feeding, see above Pain/Anxiety/Delirium protocol (if indicated): PAD protocol, RASS score 0 to -1, as needed fentanyl VAP protocol (if indicated): Yes DVT prophylaxis: SCDs until head CT back GI prophylaxis: Famotidine Glucose control: Monitor Mobility: Bedrest Code Status: DNR, see above Family Communication: Updated son by phone, see above Disposition:   Labs   CBC: No results for input(s): WBC, NEUTROABS, HGB, HCT, MCV, PLT in the last 168 hours.  Basic Metabolic Panel: No results for input(s): NA, K, CL, CO2, GLUCOSE, BUN, CREATININE, CALCIUM, MG, PHOS in the last 168 hours. GFR: CrCl cannot be calculated (Patient's most recent lab result is older than the maximum 21 days allowed.). No results for input(s): PROCALCITON, WBC, LATICACIDVEN in the last 168 hours.  Liver Function Tests: No results for input(s): AST, ALT, ALKPHOS, BILITOT, PROT, ALBUMIN in the last 168 hours. No results for input(s): LIPASE, AMYLASE in the last 168 hours. No results for input(s): AMMONIA in the last 168 hours.  ABG No results found for: PHART, PCO2ART, PO2ART, HCO3, TCO2, ACIDBASEDEF, O2SAT   Coagulation Profile: No results for input(s): INR, PROTIME in the last 168 hours.  Cardiac Enzymes: No results for input(s): CKTOTAL, CKMB, CKMBINDEX, TROPONINI in the last 168 hours.  HbA1C: No results found for: HGBA1C  CBG: No results for input(s): GLUCAP in the last 168 hours.  Review of Systems:   Cannot obtain due to intubation  Past Medical History  She,  has a past medical history of Dementia with behavioral disturbance ([place]) (01/24/2020).   Surgical History   History reviewed. No pertinent surgical history.   Social History   reports that she has never smoked. She  has never used smokeless tobacco.   Family History   Her family history is negative for Heart disease.   Allergies No Known Allergies   Home Medications  Prior to Admission medications   Medication Sig Start Date End Date Taking? Authorizing Provider  polyethylene glycol (MIRALAX / GLYCOLAX) 17 g packet Take 17 g by mouth daily. 01/26/20   Swayze, Ava, DO     Critical care time: 40 minutes   Roselie Awkward, MD [place] [facility] Pager: [phone] Cell: [phone] If no response, call [phone]

## 2020-04-02 NOTE — ED Provider Notes (Signed)
  Procedure Name: Intubation Date/Time: 04/02/2020 4:47 PM Performed by: Franchot Heidelberg, PA-C Pre-anesthesia Checklist: Patient identified, Emergency Drugs available, Suction available, Patient being monitored and Timeout performed Oxygen Delivery Method: Ambu bag Preoxygenation: Pre-oxygenation with 100% oxygen Induction Type: Rapid sequence Ventilation: Mask ventilation without difficulty and Nasal airway inserted- appropriate to patient size Laryngoscope Size: Glidescope and 3 Grade View: Grade I Tube size: 7.0 mm Number of attempts: 1 Placement Confirmation: ETT inserted through vocal cords under direct vision,  Positive ETCO2 and Breath sounds checked- equal and bilateral Secured at: 25 cm Tube secured with: ETT holder Dental Injury: Teeth and Oropharynx as per pre-operative assessment       Pt hypoxic and with AMS, not protecting airway. Intubated as described above.      Franchot Heidelberg, PA-C 04/02/20 1649    Sherwood Gambler, MD 04/02/20 [phone]

## 2020-04-02 NOTE — ED Notes (Signed)
Reported critical lab values to Lake Bells, MD via chat. Attempted to call but could not reach him via phone

## 2020-04-02 NOTE — ED Notes (Signed)
ED TO INPATIENT HANDOFF REPORT  Name/Age/Gender Catherine Alexander 68 y.o. female  Code Status    Code Status Orders  (From admission, onward)         Start     Ordered   04/02/20 1807  Do not attempt resuscitation (DNR)  Continuous       Question Answer Comment  In the event of cardiac or respiratory ARREST Do not call a "code blue"   In the event of cardiac or respiratory ARREST Do not perform Intubation, CPR, defibrillation or ACLS   In the event of cardiac or respiratory ARREST Use medication by any route, position, wound care, and other measures to relive pain and suffering. May use oxygen, suction and manual treatment of airway obstruction as needed for comfort.      04/02/20 1806        Code Status History    Date Active Date Inactive Code Status Order ID Comments User Context   01/24/2020 0601 01/25/2020 2310 Full Code [id number]  Shalhoub, Sherryll Burger, MD ED   Advance Care Planning Activity      Home/SNF/Other Home  Chief Complaint Acute hypercapnic respiratory failure ([place]) [J96.02]  Level of Care/Admitting Diagnosis ED Disposition    ED Disposition Condition [place]: [facility] [100102]  Level of Care: ICU [6]  May admit patient to [facility] or [facility] if equivalent level of care is available:: No  Covid Evaluation: Asymptomatic Screening Protocol (No Symptoms)  Diagnosis: Acute hypercapnic respiratory failure [facility]) [[id number]]  Admitting Physician: Eustaquio Boyden  Attending Physician: Juanito Doom [[id number]]  Estimated length of stay: past midnight tomorrow  Certification:: I certify this patient will need inpatient services for at least 2 midnights       Medical History Past Medical History:  Diagnosis Date  . Dementia with behavioral disturbance ([place]) 01/24/2020    Allergies No Known Allergies  IV Location/Drains/Wounds Patient Lines/Drains/Airways Status    Active Line/Drains/Airways    Name  Placement date Placement time Site Days   Peripheral IV 04/02/20 Anterior;Left;Upper Arm 04/02/20  1620  Arm  less than 1   Peripheral IV 04/02/20 Left;Posterior Forearm 04/02/20  1620  Forearm  less than 1   NG/OG Tube Orogastric 14 Fr. Left mouth Xray;Aucultation Documented cm marking at nare/ corner of mouth 04/02/20  1730  Left mouth  less than 1   Airway 7 mm 04/02/20  1441   less than 1   Intraosseous Line 04/02/20 Tibia 04/02/20  --  Left  less than 1          Labs/Imaging Results for orders placed or performed during the hospital encounter of 04/02/20 (from the past 48 hour(s))  Comprehensive metabolic panel     Status: Abnormal   Collection Time: 04/02/20  4:23 PM  Result Value Ref Range   Sodium 157 (H) 135 - 145 mmol/L   Potassium <2.0 (LL) 3.5 - 5.1 mmol/L    Comment: REPEATED TO VERIFY CRITICAL RESULT CALLED TO, READ BACK BY AND VERIFIED WITH: BANNO,L. RN AT 1800 04/02/20 MULLINS,T    Chloride >130 (HH) 98 - 111 mmol/L    Comment: REPEATED TO VERIFY CRITICAL RESULT CALLED TO, READ BACK BY AND VERIFIED WITH: BANNO,L. RN AT 1800 04/02/20 MULLINS,T    CO2 11 (L) 22 - 32 mmol/L   Glucose, Bld 68 (L) 70 - 99 mg/dL    Comment: Glucose reference range applies only to samples taken after fasting  for at least 8 hours.   BUN 47 (H) 8 - 23 mg/dL   Creatinine, Ser 0.71 0.44 - 1.00 mg/dL   Calcium <4.0 (LL) 8.9 - 10.3 mg/dL    Comment: REPEATED TO VERIFY CRITICAL RESULT CALLED TO, READ BACK BY AND VERIFIED WITH: BANNO,L. RN AT 1800 04/02/20 MULLINS,T    Total Protein <3.0 (L) 6.5 - 8.1 g/dL   Albumin 1.1 (L) 3.5 - 5.0 g/dL   AST 13 (L) 15 - 41 U/L   ALT 10 0 - 44 U/L   Alkaline Phosphatase 17 (L) 38 - 126 U/L   Total Bilirubin 0.6 0.3 - 1.2 mg/dL   GFR, Estimated >60 >60 mL/min    Comment: (NOTE) Calculated using the CKD-EPI Creatinine Equation (2021)    Anion gap NOT CALCULATED 5 - 15    Comment: Performed at [facility], [place] [address],  [place], [place] [address]  Troponin I (High Sensitivity)     Status: Abnormal   Collection Time: 04/02/20  4:23 PM  Result Value Ref Range   Troponin I (High Sensitivity) 49 (H) <18 ng/L    Comment: (NOTE) Elevated high sensitivity troponin I (hsTnI) values and significant  changes across serial measurements may suggest ACS but many other  chronic and acute conditions are known to elevate hsTnI results.  Refer to the Links section for chest pain algorithms and additional  guidance. Performed at [facility], [place] [address] [place], [place] [address]   Lactic acid, plasma     Status: Abnormal   Collection Time: 04/02/20  4:23 PM  Result Value Ref Range   Lactic Acid, Venous 2.9 (HH) 0.5 - 1.9 mmol/L    Comment: CRITICAL RESULT CALLED TO, READ BACK BY AND VERIFIED WITHJanell Quiet RN AT 1800 04/02/20 MULLINS,T Performed at [facility], [place] [address], [place], [place] [address]   CBC with Differential     Status: Abnormal   Collection Time: 04/02/20  4:23 PM  Result Value Ref Range   WBC 12.3 (H) 4.0 - 10.5 K/uL   RBC 4.80 3.87 - 5.11 MIL/uL   Hemoglobin 14.9 12.0 - 15.0 g/dL   HCT 49.8 (H) 36 - 46 %   MCV 103.8 (H) 80.0 - 100.0 fL   MCH 31.0 26.0 - 34.0 pg   MCHC 29.9 (L) 30.0 - 36.0 g/dL   RDW 14.8 11.5 - 15.5 %   Platelets 130 (L) 150 - 400 K/uL    Comment: REPEATED TO VERIFY Immature Platelet Fraction may be clinically indicated, consider ordering this additional test [id number]    nRBC 2.8 (H) 0.0 - 0.2 %   Neutrophils Relative % 88 %   Neutro Abs 10.9 (H) 1.7 - 7.7 K/uL   Lymphocytes Relative 8 %   Lymphs Abs 1.0 0.7 - 4.0 K/uL   Monocytes Relative 3 %   Monocytes Absolute 0.4 0.1 - 1.0 K/uL   Eosinophils Relative 0 %   Eosinophils Absolute 0.0 0.0 - 0.5 K/uL   Basophils Relative 0 %   Basophils Absolute 0.0 0.0 - 0.1 K/uL   Immature Granulocytes 1 %   Abs Immature Granulocytes 0.09 (H) 0.00 - 0.07 K/uL    Comment: Performed at  [facility], [place] [address], [place],  [address]  Protime-INR     Status: Abnormal   Collection Time: 04/02/20  4:23 PM  Result Value Ref Range   Prothrombin Time 39.3 (H) 11.4 - 15.2 seconds   INR  4.2 (HH) 0.8 - 1.2    Comment: CRITICAL RESULT CALLED TO, READ BACK BY AND VERIFIED WITH: G.WILSON,RN [id number] @1808  BY V.WILKINS (NOTE) INR goal varies based on device and disease states. Performed at [facility], [place] [address], [place], [place] [address]   CK     Status: None   Collection Time: 04/02/20  4:23 PM  Result Value Ref Range   Total CK 89 38.0 - 234.0 U/L    Comment: Performed at [facility], [place] [address] [place], [place] [address]  Blood gas, arterial     Status: Abnormal   Collection Time: 04/02/20  5:11 PM  Result Value Ref Range   FIO2 100.00    Delivery systems VENTILATOR    Mode PRESSURE REGULATED VOLUME CONTROL    VT 430 mL   LHR 14.0 resp/min   Peep/cpap 5.0 cm H20   pH, Arterial 7.377 7.35 - 7.45   pCO2 arterial 34.3 32 - 48 mmHg   pO2, Arterial 539 (H) 83 - 108 mmHg    Comment: REPEATED TO VERIFY   Bicarbonate 19.7 (L) 20.0 - 28.0 mmol/L   Acid-base deficit 4.2 (H) 0.0 - 2.0 mmol/L   O2 Saturation 99.3 %   Patient temperature 98.8    Collection site RIGHT RADIAL    Drawn by [id number]    Allens test (pass/fail) PASS PASS    Comment: Performed at [facility], [place] [address], [place], [place] [address]  Respiratory Panel by RT PCR (Flu A&B, Covid) - Nasopharyngeal Swab     Status: None   Collection Time: 04/02/20  5:32 PM   Specimen: Nasopharyngeal Swab  Result Value Ref Range   SARS Coronavirus 2 by RT PCR NEGATIVE NEGATIVE    Comment: (NOTE) SARS-CoV-2 target nucleic acids are NOT DETECTED.  The SARS-CoV-2 RNA is generally detectable in upper respiratoy specimens during the acute phase of infection. The lowest concentration of SARS-CoV-2 viral copies this assay can  detect is 131 copies/mL. A negative result does not preclude SARS-Cov-2 infection and should not be used as the sole basis for treatment or other patient management decisions. A negative result may occur with  improper specimen collection/handling, submission of specimen other than nasopharyngeal swab, presence of viral mutation(s) within the areas targeted by this assay, and inadequate number of viral copies (<131 copies/mL). A negative result must be combined with clinical observations, patient history, and epidemiological information. The expected result is Negative.  Fact Sheet for Patients:  PinkCheek.be  Fact Sheet for Healthcare Providers:  GravelBags.it  This test is no t yet approved or cleared by the [place] FDA and  has been authorized for detection and/or diagnosis of SARS-CoV-2 by FDA under an Emergency Use Authorization (EUA). This EUA will remain  in effect (meaning this test can be used) for the duration of the COVID-19 declaration under Section 564(b)(1) of the Act, 21 U.S.C. section 360bbb-3(b)(1), unless the authorization is terminated or revoked sooner.     Influenza A by PCR NEGATIVE NEGATIVE   Influenza B by PCR NEGATIVE NEGATIVE    Comment: (NOTE) The Xpert Xpress SARS-CoV-2/FLU/RSV assay is intended as an aid in  the diagnosis of influenza from Nasopharyngeal swab specimens and  should not be used as a sole basis for treatment. Nasal washings and  aspirates are unacceptable for Xpert Xpress SARS-CoV-2/FLU/RSV  testing.  Fact Sheet for Patients: PinkCheek.be  Fact Sheet for Healthcare Providers: GravelBags.it  This test is not yet approved or  cleared by the [place] and  has been authorized for detection and/or diagnosis of SARS-CoV-2 by  FDA under an Emergency Use Authorization (EUA). This EUA will remain  in effect  (meaning this test can be used) for the duration of the  Covid-19 declaration under Section 564(b)(1) of the Act, 21  U.S.C. section 360bbb-3(b)(1), unless the authorization is  terminated or revoked. Performed at [facility], [place] [address], [place], [place] [address]   Urinalysis, Routine w reflex microscopic     Status: Abnormal   Collection Time: 04/02/20  5:54 PM  Result Value Ref Range   Color, Urine AMBER (A) YELLOW    Comment: BIOCHEMICALS MAY BE AFFECTED BY COLOR   APPearance HAZY (A) CLEAR   Specific Gravity, Urine 1.020 1.005 - 1.030   pH 5.0 5.0 - 8.0   Glucose, UA NEGATIVE NEGATIVE mg/dL   Hgb urine dipstick SMALL (A) NEGATIVE   Bilirubin Urine NEGATIVE NEGATIVE   Ketones, ur NEGATIVE NEGATIVE mg/dL   Protein, ur 30 (A) NEGATIVE mg/dL   Nitrite NEGATIVE NEGATIVE   Leukocytes,Ua NEGATIVE NEGATIVE   RBC / HPF 0-5 0 - 5 RBC/hpf   WBC, UA 6-10 0 - 5 WBC/hpf   Bacteria, UA MANY (A) NONE SEEN   Squamous Epithelial / LPF 0-5 0 - 5   Mucus PRESENT    Hyaline Casts, UA PRESENT     Comment: Performed at [facility], [place] [address], [place], [place] [address]  Urine rapid drug screen (hosp performed)     Status: None   Collection Time: 04/02/20  5:55 PM  Result Value Ref Range   Opiates NONE DETECTED NONE DETECTED   Cocaine NONE DETECTED NONE DETECTED   Benzodiazepines NONE DETECTED NONE DETECTED   Amphetamines NONE DETECTED NONE DETECTED   Tetrahydrocannabinol NONE DETECTED NONE DETECTED   Barbiturates NONE DETECTED NONE DETECTED    Comment: (NOTE) DRUG SCREEN FOR MEDICAL PURPOSES ONLY.  IF CONFIRMATION IS NEEDED FOR ANY PURPOSE, NOTIFY LAB WITHIN 5 DAYS.  LOWEST DETECTABLE LIMITS FOR URINE DRUG SCREEN Drug Class                     Cutoff (ng/mL) Amphetamine and metabolites    1000 Barbiturate and metabolites    200 Benzodiazepine                 678 Tricyclics and metabolites     300 Opiates and metabolites         300 Cocaine and metabolites        300 THC                            50 Performed at [facility], [place] [address] [place], [place] [address]   CBG monitoring, ED     Status: Abnormal   Collection Time: 04/02/20  6:01 PM  Result Value Ref Range   Glucose-Capillary 134 (H) 70 - 99 mg/dL    Comment: Glucose reference range applies only to samples taken after fasting for at least 8 hours.  Magnesium     Status: Abnormal   Collection Time: 04/02/20  6:07 PM  Result Value Ref Range   Magnesium 3.0 (H) 1.7 - 2.4 mg/dL    Comment: Performed at [facility], [place] [address], [place],  [address]  Phosphorus     Status: Abnormal   Collection Time: 04/02/20  6:07  PM  Result Value Ref Range   Phosphorus 5.6 (H) 2.5 - 4.6 mg/dL    Comment: Performed at [facility], [place] [address] [place], [place] [address]   CT Head Wo Contrast  Result Date: 04/02/2020 CLINICAL DATA:  Question of cerebral hemorrhage EXAM: CT HEAD WITHOUT CONTRAST TECHNIQUE: Contiguous axial images were obtained from the base of the skull through the vertex without intravenous contrast. COMPARISON:  January 24, 2020 FINDINGS: Brain: No evidence of acute territorial infarction, hemorrhage, hydrocephalus,extra-axial collection or mass lesion/mass effect. There is dilatation the ventricles and sulci consistent with age-related atrophy. Low-attenuation changes in the deep white matter consistent with small vessel ischemia. Vascular: No hyperdense vessel or unexpected calcification. Skull: The skull is intact. No fracture or focal lesion identified. Sinuses/Orbits: Mucosal thickening and a small amount of fluid is seen within the bilateral maxillary sinuses. The orbits and globes intact. Other: None IMPRESSION: No acute intracranial abnormality. Findings consistent with age related atrophy and chronic small vessel ischemia Maxillary sinusitis Electronically Signed   By: Prudencio Pair M.D.   On: 04/02/2020 18:25   DG Chest Portable 1 View  Result Date: 04/02/2020 CLINICAL DATA:  Unresponsive EXAM: PORTABLE CHEST 1 VIEW COMPARISON:  January 24, 2020 FINDINGS: The heart size and mediastinal contours are within normal limits. Aortic knob calcification is seen. ETT is 2.5 cm above the level of the carina. Both lungs are clear. There is hyperinflation of the lung zones. The visualized skeletal structures are unremarkable. Probable small hiatal hernia is noted. IMPRESSION: No active disease. Electronically Signed   By: Prudencio Pair M.D.   On: 04/02/2020 17:59   DG Abd Portable 1 View  Result Date: 04/02/2020 CLINICAL DATA:  Orogastric tube placement. EXAM: PORTABLE ABDOMEN - 1 VIEW COMPARISON:  None. FINDINGS: Enteric tube tip below the diaphragm in the stomach, side-port in the region of the distal esophagus. Air-filled stomach. Moderate stool in the colon. No definite small bowel dilatation. Lung bases are clear. IMPRESSION: Enteric tube tip below the diaphragm in the stomach, side-port in the region of the distal esophagus. Recommend advancement of at least 5 cm for optimal placement. Electronically Signed   By: Keith Rake M.D.   On: 04/02/2020 19:06    Pending Labs Unresulted Labs (From admission, onward)          Start     Ordered   04/03/20 0500  CBC  Tomorrow morning,   R        04/02/20 1806   04/03/20 2542  Basic metabolic panel  Tomorrow morning,   R        04/02/20 1806   04/03/20 0500  Magnesium  Tomorrow morning,   R        04/02/20 1806   04/03/20 0500  Phosphorus  Tomorrow morning,   R        04/02/20 1806   04/02/20 1807  Cortisol  ONCE - STAT,   STAT        04/02/20 1806   04/02/20 1729  Culture, respiratory (non-expectorated)  Once,   STAT        04/02/20 1729   04/02/20 1624  Culture, blood (routine x 2)  BLOOD CULTURE X 2,   STAT      04/02/20 1623          Vitals/Pain Today's Vitals   04/02/20 1905 04/02/20 1909 04/02/20 1910 04/02/20  1915  BP:    (!) 100/59  Pulse: 90 89 88 83  Resp: 14 14 14 14   Temp: (!) 94.1 F (34.5 C) (!) 94.2 F (34.6 C) (!) 94.3 F (34.6 C) (!) 94.5 F (34.7 C)  TempSrc:      SpO2: 100% 100% 100% 100%  Height:    5\' 4"  (1.626 m)    Isolation Precautions No active isolations  Medications Medications  docusate sodium (COLACE) capsule 100 mg (has no administration in time range)  polyethylene glycol (MIRALAX / GLYCOLAX) packet 17 g (has no administration in time range)  docusate (COLACE) 50 MG/5ML liquid 100 mg (has no administration in time range)  polyethylene glycol (MIRALAX / GLYCOLAX) packet 17 g (has no administration in time range)  feeding supplement (VITAL HIGH PROTEIN) liquid 1,000 mL (has no administration in time range)  dextrose 5 % and 0.2 % NaCl with KCl 20 mEq infusion (has no administration in time range)  acetaminophen (TYLENOL) tablet 650 mg (has no administration in time range)  ondansetron (ZOFRAN) injection 4 mg (has no administration in time range)  famotidine (PEPCID) IVPB 20 mg premix (has no administration in time range)  fentaNYL (SUBLIMAZE) injection 25 mcg (has no administration in time range)  fentaNYL (SUBLIMAZE) injection 25-100 mcg (has no administration in time range)  midazolam (VERSED) injection 1 mg (1 mg Intravenous Given 04/02/20 1846)  midazolam (VERSED) injection 1 mg (has no administration in time range)  potassium chloride 10 mEq in 100 mL IVPB (10 mEq Intravenous New Bag/Given 04/02/20 1858)  calcium gluconate 1 g/ 50 mL sodium chloride IVPB (1,000 mg Intravenous New Bag/Given 04/02/20 1857)  potassium chloride 20 MEQ/15ML (10%) solution 40 mEq (has no administration in time range)  sodium chloride 0.9 % bolus 1,000 mL (0 mLs Intravenous Stopped 04/02/20 1720)  etomidate (AMIDATE) injection 10 mg (10 mg Intravenous Given 04/02/20 1634)  rocuronium bromide 10 mg/mL (PF) syringe (40 mg Intravenous Given 04/02/20 1635)    Mobility non-ambulatory

## 2020-04-03 LAB — CORTISOL: Cortisol, Plasma: 91.3 ug/dL

## 2020-04-03 LAB — CBC
HCT: 47.2 % — ABNORMAL HIGH (ref 36.0–46.0)
Hemoglobin: 14.2 g/dL (ref 12.0–15.0)
MCH: 31.2 pg (ref 26.0–34.0)
MCHC: 30.1 g/dL (ref 30.0–36.0)
MCV: 103.7 fL — ABNORMAL HIGH (ref 80.0–100.0)
Platelets: 121 10*3/uL — ABNORMAL LOW (ref 150–400)
RBC: 4.55 MIL/uL (ref 3.87–5.11)
RDW: 15.1 % (ref 11.5–15.5)
WBC: 13.4 10*3/uL — ABNORMAL HIGH (ref 4.0–10.5)
nRBC: 3.7 % — ABNORMAL HIGH (ref 0.0–0.2)

## 2020-04-03 LAB — GLUCOSE, CAPILLARY
Glucose-Capillary: 102 mg/dL — ABNORMAL HIGH (ref 70–99)
Glucose-Capillary: 134 mg/dL — ABNORMAL HIGH (ref 70–99)
Glucose-Capillary: 148 mg/dL — ABNORMAL HIGH (ref 70–99)
Glucose-Capillary: 149 mg/dL — ABNORMAL HIGH (ref 70–99)
Glucose-Capillary: 155 mg/dL — ABNORMAL HIGH (ref 70–99)
Glucose-Capillary: 160 mg/dL — ABNORMAL HIGH (ref 70–99)
Glucose-Capillary: 99 mg/dL (ref 70–99)

## 2020-04-03 LAB — BASIC METABOLIC PANEL
BUN: 106 mg/dL — ABNORMAL HIGH (ref 8–23)
CO2: 21 mmol/L — ABNORMAL LOW (ref 22–32)
Calcium: 8.8 mg/dL — ABNORMAL LOW (ref 8.9–10.3)
Chloride: >130 mmol/L (ref 98–111)
Creatinine, Ser: 2.76 mg/dL — ABNORMAL HIGH (ref 0.44–1.00)
GFR, Estimated: 18 mL/min — ABNORMAL LOW (ref >60)
Glucose, Bld: 121 mg/dL — ABNORMAL HIGH (ref 70–99)
Potassium: 4.9 mmol/L (ref 3.5–5.1)
Sodium: 171 mmol/L (ref 135–145)

## 2020-04-03 LAB — PHOSPHORUS: Phosphorus: 3.4 mg/dL (ref 2.5–4.6)

## 2020-04-03 LAB — MAGNESIUM: Magnesium: 2.9 mg/dL — ABNORMAL HIGH (ref 1.7–2.4)

## 2020-04-03 MED ORDER — FREE WATER
300.0000 mL | Status: DC
  Administered 2020-04-03 – 2020-04-06 (×37): 300 mL

## 2020-04-03 MED ORDER — FAMOTIDINE IN NACL 20-0.9 MG/50ML-% IV SOLN
20.0000 mg | INTRAVENOUS | Status: DC
  Administered 2020-04-03 – 2020-04-12 (×10): 20 mg via INTRAVENOUS
  Filled 2020-04-03 (×10): qty 50

## 2020-04-03 MED ORDER — SODIUM CHLORIDE 0.9 % IV SOLN
1.5000 g | Freq: Two times a day (BID) | INTRAVENOUS | Status: DC
  Administered 2020-04-03 – 2020-04-05 (×4): 1.5 g via INTRAVENOUS
  Filled 2020-04-03: qty 1.5
  Filled 2020-04-03 (×4): qty 4

## 2020-04-03 NOTE — Progress Notes (Signed)
NAME:  Catherine Alexander, MRN:  [id number], DOB:  07-13-1950, LOS: 1 ADMISSION DATE:  04/02/2020, CONSULTATION DATE:  11/6 REFERRING MD:  Regenia Skeeter, CHIEF COMPLAINT:  Unresponsive   Brief History   69 y/o female admitted from the [facility] emergency department after she was found unresponsive at home on 04/02/2020.  She has a history of advanced Parkinson's disease and has had poor p.o. intake for several weeks prior to admission  Past Medical History  Advanced Parkinson's disease  Significant Hospital Events   November 6 admission  Consults:    Procedures:  November 6 endotracheal tube  Significant Diagnostic Tests:  November 6 CT head> NAICP, age related atrophy, maxillary sinusitis  Micro Data:  November 6 SARS-CoV-2> negative  Antimicrobials:  None  Interim history/subjective:   No acute events No purposeful movements Renal function worsening  Objective   Blood pressure (!) 101/51, pulse 94, temperature 99.7 F (37.6 C), resp. rate (!) 22, height 5\' 4"  (1.626 m), SpO2 99 %.    Vent Mode: PRVC FiO2 (%):  [40 %-100 %] 40 % Set Rate:  [14 bmp] 14 bmp Vt Set:  [330 mL-430 mL] 430 mL PEEP:  [5 cmH20] 5 cmH20 Plateau Pressure:  [10 cmH20-12 cmH20] 11 cmH20   Intake/Output Summary (Last 24 hours) at 04/03/2020 0750 Last data filed at 04/02/2020 1945 Gross per 24 hour  Intake 1150 ml  Output --  Net 1150 ml   There were no vitals filed for this visit.  Examination:  General:  In bed on vent HENT: NCAT ETT in place PULM: CTA B, vent supported breathing CV: RRR, no mgr GI: BS+, soft, nontender MSK: severely diminished bulk/tone of musculature Neuro: asleep on vent, minimal response to external stimuli    Resolved Hospital Problem list     Assessment & Plan:  Acute respiratory failure with hypoxemia due to generalized muscle weakness, possible aspiration Continue full vent support VAP prevention Unasyn CXR prn  Possible aspiration  pneumonitis: Unasyn  Hypernatremia and acute kidney injury due to poor p.o. intake and volume depletion AKI worse 11/7, not a dialysis candidate Continue gentle IV fluids given low weight Continue free water via tube Monitor BMET and UOP Replace electrolytes as needed Monitor UOP  Malnutrition, severe: Continue trickle feedings Monitor Mg/Pho  Advanced Parkinson's disease Failure to thrive in an adult History of subdural hematoma Goals of care: discussed with her son, he says she would not want to live this way.  He will have the grandkids come see her today, plan will be one way extubation in AM.  Giving water, tube feedings overnight to see if she regains some level of conciousness, but I doubt she will.  She would not want prolonged ventilation so need to plan to extubate on 11/8 regardless her condition.    Best practice:  Diet: Tube feeding, see above Pain/Anxiety/Delirium protocol (if indicated): PAD protocol, RASS score 0 to -1, as needed fentanyl VAP protocol (if indicated): Yes DVT prophylaxis: SCDs until head CT back GI prophylaxis: Famotidine Glucose control: Monitor Mobility: Bedrest Code Status: DNR, see above Family Communication: Updated son at bedside Disposition:   Labs   CBC: Recent Labs  Lab 04/02/20 1623 04/03/20 0300  WBC 12.3* 13.4*  NEUTROABS 10.9*  --   HGB 14.9 14.2  HCT 49.8* 47.2*  MCV 103.8* 103.7*  PLT 130* 121*    Basic Metabolic Panel: Recent Labs  Lab 04/02/20 1623 04/02/20 1807 04/03/20 0300  NA 157*  --  171*  K <  2.0*  --  4.9  CL >130*  --  >130*  CO2 11*  --  21*  GLUCOSE 68*  --  121*  BUN 47*  --  106*  CREATININE 0.71  --  2.76*  CALCIUM <4.0*  --  8.8*  MG  --  3.0* 2.9*  PHOS  --  5.6* 3.4   GFR: CrCl cannot be calculated (Unknown ideal weight.). Recent Labs  Lab 04/02/20 1623 04/02/20 2029 04/03/20 0300  WBC 12.3*  --  13.4*  LATICACIDVEN 2.9* 2.9*  --     Liver Function Tests: Recent Labs  Lab  04/02/20 1623  AST 13*  ALT 10  ALKPHOS 17*  BILITOT 0.6  PROT <3.0*  ALBUMIN 1.1*   No results for input(s): LIPASE, AMYLASE in the last 168 hours. No results for input(s): AMMONIA in the last 168 hours.  ABG    Component Value Date/Time   PHART 7.377 04/02/2020 1711   PCO2ART 34.3 04/02/2020 1711   PO2ART 539 (H) 04/02/2020 1711   HCO3 19.7 (L) 04/02/2020 1711   ACIDBASEDEF 4.2 (H) 04/02/2020 1711   O2SAT 99.3 04/02/2020 1711     Coagulation Profile: Recent Labs  Lab 04/02/20 1623  INR 4.2*    Cardiac Enzymes: Recent Labs  Lab 04/02/20 1623  CKTOTAL 89    HbA1C: No results found for: HGBA1C  CBG: Recent Labs  Lab 04/02/20 1801 04/02/20 2056 04/03/20 0136 04/03/20 0319  GLUCAP 134* 104* 102* 99    Review of Systems:   Cannot obtain due to intubation  Past Medical History  She,  has a past medical history of Dementia with behavioral disturbance ([place]) (01/24/2020).   Surgical History   History reviewed. No pertinent surgical history.   Social History   reports that she has never smoked. She has never used smokeless tobacco.   Family History   Her family history is negative for Heart disease.   Allergies No Known Allergies   Home Medications  Prior to Admission medications   Medication Sig Start Date End Date Taking? Authorizing Provider  polyethylene glycol (MIRALAX / GLYCOLAX) 17 g packet Take 17 g by mouth daily. 01/26/20   Swayze, Ava, DO     Critical care time: 35 minutes   Roselie Awkward, MD [place] [facility] Pager: [phone] Cell: [phone] If no response, call [phone]

## 2020-04-03 NOTE — Progress Notes (Signed)
eLink Physician-Brief Progress Note Patient Name: Catherine Alexander DOB: 1951-04-17 MRN: [id number]   Date of Service  04/03/2020  HPI/Events of Note  Patient's son had questions about the parkinson's disease process.  eICU Interventions  I answered questions and explained the natural history of parkinson's disease  , including the irreversible nature of the illness at end stage.        Kerry Kass [name] 04/03/2020, 9:30 PM

## 2020-04-03 NOTE — Progress Notes (Signed)
CRITICAL VALUE ALERT  Critical Value:  Na - 171  Date & Time Notied:  04/03/2020 @ 0410  Provider Notified: E-Link Nurse  Orders Received/Actions taken: Awaiting orders

## 2020-04-03 NOTE — Progress Notes (Signed)
Pharmacy Antibiotic Note  Catherine Alexander is a 69 y.o. female admitted on 04/02/2020 with respiratory failure, possible aspiration.  Pharmacy has been consulted for Unasyn dosing.  Plan: Unasyn 1.5g IV q12h Follow up renal function & cultures  Height: 5\' 4"  (162.6 cm) IBW/kg (Calculated) : 54.7  Temp (24hrs), Avg:96.3 F (35.7 C), Min:92.2 F (33.4 C), Max:100.4 F (38 C)  Recent Labs  Lab 04/02/20 1623 04/02/20 2029 04/03/20 0300  WBC 12.3*  --  13.4*  CREATININE 0.71  --  2.76*  LATICACIDVEN 2.9* 2.9*  --     CrCl cannot be calculated (Unknown ideal weight.).  25 ml/min/1.41m2  No Known Allergies  Antimicrobials this admission: 11/7 Unasyn >>  Dose adjustments this admission:  Microbiology results: 11/6 BCx: ngtd 11/6 MRSA PCR: neg 11/6 COVID neg, Flu neg 11/7 Trach asp:  Thank you for allowing pharmacy to be a part of this patient's care.  Peggyann Juba, PharmD, BCPS Pharmacy: [phone] 04/03/2020 3:20 PM

## 2020-04-03 NOTE — Progress Notes (Signed)
[facility] Progress Note Patient Name: Catherine Alexander DOB: 20-Dec-1950 MRN: [id number]   Date of Service  04/03/2020  HPI/Events of Note  Na+ 171, Cl 130. Patient is profoundly volume depleted.  eICU Interventions  Free water 300 ml via NG tube Q 2 hours        Jasmene Goswami U Tavion Senkbeil 04/03/2020, 4:55 AM

## 2020-04-03 NOTE — Progress Notes (Signed)
CRITICAL VALUE ALERT  Critical Value:  Chloride: >30  Date & Time Notied:  04/03/2020 @ 0410  Provider Notified: E-Link nurse  Orders Received/Actions taken: Awaiting orders

## 2020-04-04 ENCOUNTER — Inpatient Hospital Stay (HOSPITAL_COMMUNITY): Payer: Self-pay

## 2020-04-04 DIAGNOSIS — E43 Unspecified severe protein-calorie malnutrition: Secondary | ICD-10-CM | POA: Insufficient documentation

## 2020-04-04 DIAGNOSIS — G20 Parkinson's disease: Secondary | ICD-10-CM

## 2020-04-04 LAB — GLUCOSE, CAPILLARY
Glucose-Capillary: 142 mg/dL — ABNORMAL HIGH (ref 70–99)
Glucose-Capillary: 140 mg/dL — ABNORMAL HIGH (ref 70–99)
Glucose-Capillary: 139 mg/dL — ABNORMAL HIGH (ref 70–99)
Glucose-Capillary: 115 mg/dL — ABNORMAL HIGH (ref 70–99)
Glucose-Capillary: 135 mg/dL — ABNORMAL HIGH (ref 70–99)
Glucose-Capillary: 127 mg/dL — ABNORMAL HIGH (ref 70–99)

## 2020-04-04 LAB — BASIC METABOLIC PANEL
Anion gap: 10 (ref 5–15)
Anion gap: 15 (ref 5–15)
BUN: 107 mg/dL — ABNORMAL HIGH (ref 8–23)
BUN: 106 mg/dL — ABNORMAL HIGH (ref 8–23)
CO2: 20 mmol/L — ABNORMAL LOW (ref 22–32)
CO2: 16 mmol/L — ABNORMAL LOW (ref 22–32)
Calcium: 7.8 mg/dL — ABNORMAL LOW (ref 8.9–10.3)
Calcium: 7.8 mg/dL — ABNORMAL LOW (ref 8.9–10.3)
Chloride: 118 mmol/L — ABNORMAL HIGH (ref 98–111)
Chloride: 115 mmol/L — ABNORMAL HIGH (ref 98–111)
Creatinine, Ser: 2.35 mg/dL — ABNORMAL HIGH (ref 0.44–1.00)
Creatinine, Ser: 2.01 mg/dL — ABNORMAL HIGH (ref 0.44–1.00)
GFR, Estimated: 22 mL/min — ABNORMAL LOW (ref >60)
GFR, Estimated: 26 mL/min — ABNORMAL LOW (ref >60)
Glucose, Bld: 171 mg/dL — ABNORMAL HIGH (ref 70–99)
Glucose, Bld: 156 mg/dL — ABNORMAL HIGH (ref 70–99)
Potassium: 4.2 mmol/L (ref 3.5–5.1)
Potassium: 4.2 mmol/L (ref 3.5–5.1)
Sodium: 148 mmol/L — ABNORMAL HIGH (ref 135–145)
Sodium: 146 mmol/L — ABNORMAL HIGH (ref 135–145)

## 2020-04-04 LAB — MAGNESIUM
Magnesium: 2.3 mg/dL (ref 1.7–2.4)
Magnesium: 2.2 mg/dL (ref 1.7–2.4)

## 2020-04-04 LAB — LACTIC ACID, PLASMA: Lactic Acid, Venous: 3.3 mmol/L (ref 0.5–1.9)

## 2020-04-04 LAB — PHOSPHORUS
Phosphorus: 2.4 mg/dL — ABNORMAL LOW (ref 2.5–4.6)
Phosphorus: 2.2 mg/dL — ABNORMAL LOW (ref 2.5–4.6)

## 2020-04-04 MED ORDER — CHLORHEXIDINE GLUCONATE 0.12 % MT SOLN
15.0000 mL | Freq: Two times a day (BID) | OROMUCOSAL | Status: DC
  Administered 2020-04-04: 15 mL via OROMUCOSAL
  Filled 2020-04-04: qty 15

## 2020-04-04 MED ORDER — ORAL CARE MOUTH RINSE
15.0000 mL | Freq: Two times a day (BID) | OROMUCOSAL | Status: DC
  Administered 2020-04-04: 15 mL via OROMUCOSAL

## 2020-04-04 MED ORDER — OSMOLITE 1.2 CAL PO LIQD
1000.0000 mL | ORAL | Status: DC
  Administered 2020-04-04 – 2020-04-11 (×5): 1000 mL
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
## 2020-04-04 NOTE — Progress Notes (Signed)
NAME:  Catherine Alexander, MRN:  [id number], DOB:  Oct 07, 1950, LOS: 2 ADMISSION DATE:  04/02/2020, CONSULTATION DATE:  11/6 REFERRING MD:  Regenia Skeeter, CHIEF COMPLAINT:  Unresponsive   Brief History   69 y/o female admitted from the [facility] emergency department after she was found unresponsive at home on 04/02/2020.  She has a history of advanced Parkinson's disease and has had poor p.o. intake for several weeks prior to admission  Past Medical History  Advanced Parkinson's disease  Significant Hospital Events   November 6 admission  Consults:    Procedures:  November 6 endotracheal tube  Significant Diagnostic Tests:  November 6 CT head> NAICP, age related atrophy, maxillary sinusitis  Micro Data:  November 6 SARS-CoV-2> negative  Antimicrobials:  Unasyn 9/7>>  Interim history/subjective:   No acute events No purposeful movements noted Tolerating pressure support  Objective   Blood pressure (!) 103/43, pulse 67, temperature 97.9 F (36.6 C), resp. rate 16, height 5\' 4"  (1.626 m), weight 33.7 kg, SpO2 100 %.    Vent Mode: CPAP;PSV FiO2 (%):  [30 %] 30 % Set Rate:  [14 bmp] 14 bmp Vt Set:  [430 mL] 430 mL PEEP:  [5 cmH20] 5 cmH20 Pressure Support:  [5 cmH20] 5 cmH20 Plateau Pressure:  [12 cmH20-15 cmH20] 12 cmH20   Intake/Output Summary (Last 24 hours) at 04/04/2020 0849 Last data filed at 04/04/2020 0840 Gross per 24 hour  Intake 3069.81 ml  Output 710 ml  Net 2359.81 ml   Filed Weights   04/04/20 0426  Weight: 33.7 kg    Examination:  General: Chronically ill appearing, on vent HENT: Dry oral mucosa, endotracheal tube in place PULM: Clear to auscultation bilaterally CV: S1-S2 appreciated GI: Bowel sounds appreciated MSK: severely diminished bulk/tone of musculature Neuro: No significant response to stimuli  Resolved Hospital Problem list     Assessment & Plan:  Acute respiratory failure with hypoxemia Possible aspiration pneumonia -Full vent  support -Continue Unasyn for aspiration -VAP prevention in place  For possible aspiration pneumonitis -Continue Unasyn -Follow-up on chest x-ray today -Chest x-ray on 11/6 did not show any infiltrative process  Hypernatremia Acute kidney injury Severe dehydration -Continue to replete fluids -Trend electrolytes -Avoid nephrotoxic's -Renal parameters stabilizing -No clear indication for renal replacement therapy at present  Severe malnutrition -Continue trickle feeds -Monitor electrolytes and replete as needed  Patient with advanced Parkinson's disease Failure to thrive History of subdural hematoma -Goals of care discussions ongoing -Plan had been for one-way extubation -We will follow up with son today and make further plans depending on discussions -Remains encephalopathic at present  She should be extubatable today the concern will be a history of failure to thrive and progressive deterioration from underlying Parkinson's.  Best practice:  Diet: Continue tube feeds Pain/Anxiety/Delirium protocol (if indicated): PAD protocol, RASS score 0 to -1, as needed fentanyl VAP protocol (if indicated): Yes DVT prophylaxis: SCDs until head CT back GI prophylaxis: Famotidine Glucose control: Monitor Mobility: Bedrest Code Status: DNR, see above Family Communication: Will update  disposition: ICU  Labs   CBC: Recent Labs  Lab 04/02/20 1623 04/03/20 0300  WBC 12.3* 13.4*  NEUTROABS 10.9*  --   HGB 14.9 14.2  HCT 49.8* 47.2*  MCV 103.8* 103.7*  PLT 130* 121*    Basic Metabolic Panel: Recent Labs  Lab 04/02/20 1623 04/02/20 1807 04/03/20 0300 04/04/20 0012 04/04/20 0247  NA 157*  --  171* 148* 146*  K <2.0*  --  4.9 4.2 4.2  CL >130*  --  >130* 118* 115*  CO2 11*  --  21* 20* 16*  GLUCOSE 68*  --  121* 171* 156*  BUN 47*  --  106* 107* 106*  CREATININE 0.71  --  2.76* 2.35* 2.01*  CALCIUM <4.0*  --  8.8* 7.8* 7.8*  MG  --  3.0* 2.9*  --   --   PHOS  --  5.6*  3.4  --   --    GFR: Estimated Creatinine Clearance: 14.1 mL/min (A) (by C-G formula based on SCr of 2.01 mg/dL (H)). Recent Labs  Lab 04/02/20 1623 04/02/20 2029 04/03/20 0300 04/04/20 0012  WBC 12.3*  --  13.4*  --   LATICACIDVEN 2.9* 2.9*  --  3.3*    Liver Function Tests: Recent Labs  Lab 04/02/20 1623  AST 13*  ALT 10  ALKPHOS 17*  BILITOT 0.6  PROT <3.0*  ALBUMIN 1.1*   No results for input(s): LIPASE, AMYLASE in the last 168 hours. No results for input(s): AMMONIA in the last 168 hours.  ABG    Component Value Date/Time   PHART 7.377 04/02/2020 1711   PCO2ART 34.3 04/02/2020 1711   PO2ART 539 (H) 04/02/2020 1711   HCO3 19.7 (L) 04/02/2020 1711   ACIDBASEDEF 4.2 (H) 04/02/2020 1711   O2SAT 99.3 04/02/2020 1711     Coagulation Profile: Recent Labs  Lab 04/02/20 1623  INR 4.2*    Cardiac Enzymes: Recent Labs  Lab 04/02/20 1623  CKTOTAL 89    HbA1C: No results found for: HGBA1C  CBG: Recent Labs  Lab 04/03/20 1708 04/03/20 1949 04/03/20 2338 04/04/20 0337 04/04/20 0749  GLUCAP 160* 149* 155* 142* 140*    Review of Systems:   Cannot obtain due to intubation  Past Medical History  She,  has a past medical history of Dementia with behavioral disturbance ([place]) (01/24/2020).   Surgical History   History reviewed. No pertinent surgical history.   Social History   reports that she has never smoked. She has never used smokeless tobacco.   Family History   Her family history is negative for Heart disease.   Allergies No Known Allergies   The patient is critically ill with multiple organ systems failure and requires high complexity decision making for assessment and support, frequent evaluation and titration of therapies, application of advanced monitoring technologies and extensive interpretation of multiple databases. Critical Care Time devoted to patient care services described in this note independent of APP/resident time (if  applicable)  is 30 minutes.   Sherrilyn Rist MD [facility] Personal pager: [phone] If unanswered, please page CCM On-call: [phone]

## 2020-04-04 NOTE — Progress Notes (Signed)
LCSW met with patient and adult son, HE Ulibarri, who provided translation and hx for patient.   Mr. Pancoast shared that pt's physical health conditions have declined in the last six months, shortly after the passing of pt's spouse. He reports difficulty managing the increase in care for patient noting he has very limited support in the home. He is also requesting a referral to Neurology.  Family is interested in supportive in-home services. Pt is currently uninsured. LCSW discussed services provided by [facility], in addition, to [employer] to determine pt's eligibility for emergency medicaid.   Plan: LCSW agreed to reach out to [facility], in addition, to [employer] to discuss pt's eligibility for services. LCSW will follow up with family via phone with updated information

## 2020-04-04 NOTE — Procedures (Signed)
Extubation Procedure Note  Patient Details:   Name: Catherine Alexander DOB: 1951-02-13 MRN: [id number]   Airway Documentation:    Vent end date: 04/04/20 Vent end time: 1240   Evaluation  O2 sats: stable throughout Complications: No apparent complications Patient did tolerate procedure well. Bilateral Breath Sounds: Diminished, Clear   No (not responding to RT prior to extubation)  Baldwin [place] Nannette 04/04/2020, 12:43 PM   Positive cuff leak pre extubation. Placed on 2 lpm post extubation.

## 2020-04-04 NOTE — Progress Notes (Signed)
In-dept conversation with son about patients condition. Son seeking reassurance of mothers declining state and irreversibility of Parkinson's disease. Dr.Ogan present via e-link to further explain POC and Parkinson's. Son agreed POC is appropriate and what his mother would want. Son seems hesitant about AM extubation so further education my be needed beforehand.

## 2020-04-05 DIAGNOSIS — E43 Unspecified severe protein-calorie malnutrition: Secondary | ICD-10-CM

## 2020-04-05 LAB — BASIC METABOLIC PANEL
Anion gap: 8 (ref 5–15)
BUN: 42 mg/dL — ABNORMAL HIGH (ref 8–23)
CO2: 19 mmol/L — ABNORMAL LOW (ref 22–32)
Calcium: 8.1 mg/dL — ABNORMAL LOW (ref 8.9–10.3)
Chloride: 112 mmol/L — ABNORMAL HIGH (ref 98–111)
Creatinine, Ser: 1.15 mg/dL — ABNORMAL HIGH (ref 0.44–1.00)
GFR, Estimated: 52 mL/min — ABNORMAL LOW (ref >60)
Glucose, Bld: 142 mg/dL — ABNORMAL HIGH (ref 70–99)
Potassium: 4.1 mmol/L (ref 3.5–5.1)
Sodium: 139 mmol/L (ref 135–145)

## 2020-04-05 LAB — GLUCOSE, CAPILLARY
Glucose-Capillary: 125 mg/dL — ABNORMAL HIGH (ref 70–99)
Glucose-Capillary: 127 mg/dL — ABNORMAL HIGH (ref 70–99)
Glucose-Capillary: 120 mg/dL — ABNORMAL HIGH (ref 70–99)
Glucose-Capillary: 109 mg/dL — ABNORMAL HIGH (ref 70–99)
Glucose-Capillary: 100 mg/dL — ABNORMAL HIGH (ref 70–99)

## 2020-04-05 LAB — MAGNESIUM
Magnesium: 2.1 mg/dL (ref 1.7–2.4)
Magnesium: 2.2 mg/dL (ref 1.7–2.4)
Magnesium: 2.2 mg/dL (ref 1.7–2.4)

## 2020-04-05 LAB — PHOSPHORUS
Phosphorus: 2.7 mg/dL (ref 2.5–4.6)
Phosphorus: 2.9 mg/dL (ref 2.5–4.6)
Phosphorus: 2.6 mg/dL (ref 2.5–4.6)

## 2020-04-05 MED ORDER — CHLORHEXIDINE GLUCONATE 0.12 % MT SOLN
15.0000 mL | Freq: Two times a day (BID) | OROMUCOSAL | Status: DC
  Administered 2020-04-05 – 2020-04-12 (×16): 15 mL via OROMUCOSAL
  Filled 2020-04-05 (×15): qty 15

## 2020-04-05 MED ORDER — AMOXICILLIN-POT CLAVULANATE 875-125 MG PO TABS
1.0000 | ORAL_TABLET | Freq: Two times a day (BID) | ORAL | Status: DC
  Filled 2020-04-05: qty 1

## 2020-04-05 MED ORDER — ORAL CARE MOUTH RINSE
15.0000 mL | Freq: Two times a day (BID) | OROMUCOSAL | Status: DC
  Administered 2020-04-05 – 2020-04-12 (×16): 15 mL via OROMUCOSAL
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
## 2020-04-05 NOTE — Progress Notes (Signed)
NAME:  Catherine Alexander, MRN:  [id number], DOB:  [birth year], LOS: 3 ADMISSION DATE:  04/02/2020, CONSULTATION DATE:  11/6 REFERRING MD:  Regenia Skeeter, CHIEF COMPLAINT:  Unresponsive   Brief History   69 y/o female admitted from the [facility] emergency department after she was found unresponsive at home on 04/02/2020.  She has a history of advanced Parkinson's disease and has had poor p.o. intake for several weeks prior to admission  Past Medical History  Advanced Parkinson's disease  Significant Hospital Events   November 6 admission  Consults:    Procedures:  November 6 endotracheal tube Extubated 11/8 Significant Diagnostic Tests:  November 6 CT head> NAICP, age related atrophy, maxillary sinusitis  Micro Data:  November 6 SARS-CoV-2> negative  Antimicrobials:  Unasyn 9/7-9/9 augmentin 9/9>> for 7 days total Interim history/subjective:   Extubated 11/8  Objective   Blood pressure (!) 99/30, pulse 65, temperature 99.4 F (37.4 C), temperature source Axillary, resp. rate 16, height 5\' 4"  (1.626 m), weight 39.3 kg, SpO2 97 %.        Intake/Output Summary (Last 24 hours) at 04/05/2020 1451 Last data filed at 04/05/2020 5038 Gross per 24 hour  Intake 3744.31 ml  Output 3800 ml  Net -55.69 ml   Filed Weights   04/04/20 0426 04/05/20 0500  Weight: 33.7 kg 39.3 kg    Examination:  General: Chronically ill-appearing, on room air HENT: Dry oral mucosa PULM: Clear to auscultation bilaterally CV: S1-S2 appreciated GI: Bowel sounds appreciated MSK: Severely diminished bulk Neuro: Awake and aware, not following commands  Resolved Hospital Problem list     Assessment & Plan:  Acute respiratory failure with hypoxemia Possible aspiration pneumonia -Currently on room air -Unasyn switched to Augmentin per tube  Aspiration pneumonitis -Complete course of Augmentin  Hyponatremia -Resolved Acute kidney injury -Resolving  Postop pain severe malnutrition -Continue tube  feeds -Monitor electrolytes and replete as needed  Patient with advanced Parkinson's disease Failure to thrive History of subdural hematoma -Successfully extubated 11/8 -She is DNR  Paperwork for emergency funding applied for by social services Non citizen, uncovered Son who was taking care of her but will likely not be able to take care of her if she is completely dependent   -Social work involved -Nurse, learning disability to [employer]:  Diet: Continue tube feeds Pain/Anxiety/Delirium protocol (if indicated): PAD protocol, RASS score 0 to -1, as needed fentanyl VAP protocol (if indicated): Yes DVT prophylaxis: SCDs until head CT back GI prophylaxis: Famotidine Glucose control: Monitor Mobility: Bedrest Code Status: DNR, see above Family Communication: Will update  disposition: ICU  Labs   CBC: Recent Labs  Lab 04/02/20 1623 04/03/20 0300  WBC 12.3* 13.4*  NEUTROABS 10.9*  --   HGB 14.9 14.2  HCT 49.8* 47.2*  MCV 103.8* 103.7*  PLT 130* 121*    Basic Metabolic Panel: Recent Labs  Lab 04/02/20 1623 04/02/20 1807 04/03/20 0300 04/04/20 0012 04/04/20 0247 04/04/20 1805 04/05/20 0243 04/05/20 1241  NA 157*  --  171* 148* 146*  --   --  139  K <2.0*  --  4.9 4.2 4.2  --   --  4.1  CL >130*  --  >130* 118* 115*  --   --  112*  CO2 11*  --  21* 20* 16*  --   --  19*  GLUCOSE 68*  --  121* 171* 156*  --   --  142*  BUN 47*  --  106* 107* 106*  --   --  42*  CREATININE 0.71  --  2.76* 2.35* 2.01*  --   --  1.15*  CALCIUM <4.0*  --  8.8* 7.8* 7.8*  --   --  8.1*  MG  --    < > 2.9*  --  2.3 2.2 2.1 2.2  PHOS  --    < > 3.4  --  2.4* 2.2* 2.7 2.9   < > = values in this interval not displayed.   GFR: Estimated Creatinine Clearance: 28.6 mL/min (A) (by C-G formula based on SCr of 1.15 mg/dL (H)). Recent Labs  Lab 04/02/20 1623 04/02/20 2029 04/03/20 0300 04/04/20 0012  WBC 12.3*  --  13.4*  --   LATICACIDVEN 2.9* 2.9*  --  3.3*    Liver Function  Tests: Recent Labs  Lab 04/02/20 1623  AST 13*  ALT 10  ALKPHOS 17*  BILITOT 0.6  PROT <3.0*  ALBUMIN 1.1*   No results for input(s): LIPASE, AMYLASE in the last 168 hours. No results for input(s): AMMONIA in the last 168 hours.  ABG    Component Value Date/Time   PHART 7.377 04/02/2020 1711   PCO2ART 34.3 04/02/2020 1711   PO2ART 539 (H) 04/02/2020 1711   HCO3 19.7 (L) 04/02/2020 1711   ACIDBASEDEF 4.2 (H) 04/02/2020 1711   O2SAT 99.3 04/02/2020 1711     Coagulation Profile: Recent Labs  Lab 04/02/20 1623  INR 4.2*    Cardiac Enzymes: Recent Labs  Lab 04/02/20 1623  CKTOTAL 89    HbA1C: No results found for: HGBA1C  CBG: Recent Labs  Lab 04/04/20 1942 04/04/20 2317 04/05/20 0321 04/05/20 0730 04/05/20 1127  GLUCAP 135* 127* 125* 127* 120*    Review of Systems:   Cannot obtain due to intubation  Past Medical History  She,  has a past medical history of Dementia with behavioral disturbance ([place]) (01/24/2020).   Surgical History   History reviewed. No pertinent surgical history.   Social History   reports that she has never smoked. She has never used smokeless tobacco.   Family History   Her family history is negative for Heart disease.   Allergies No Known Allergies   Sherrilyn Rist, MD [facility] [facility] Pager: [phone]

## 2020-04-05 NOTE — TOC Initial Note (Addendum)
Transition of Care [facility]) - Initial/Assessment Note    Patient Details  Name: Catherine Alexander MRN: [id number] Date of Birth: Feb 07, 1951  Transition of Care [facility]) CM/SW Contact:    Leeroy Cha, RN Phone Number: 04/05/2020, 7:53 AM  Clinical Narrative:                 69 y/o female admitted from the [facility] emergency department after she was found unresponsive at home on 04/02/2020.  She has a history of advanced Parkinson's disease and has had poor p.o. intake for several weeks prior to admission Lives with the son is from [place] here visiting since the pandemic Plan  Following up with son. Following for progression. Expected Discharge Plan: Home/Self Care Barriers to Discharge: No Barriers Identified   Patient Goals and CMS Choice Patient states their goals for this hospitalization and ongoing recovery are:: to go home CMS Medicare.gov Compare Post Acute Care list provided to:: Patient    Expected Discharge Plan and Services Expected Discharge Plan: Home/Self Care   Discharge Planning Services: CM Consult   Living arrangements for the past 2 months: Single Family Home                                      Prior Living Arrangements/Services Living arrangements for the past 2 months: Single Family Home Lives with:: Self Patient language and need for interpreter reviewed:: Yes (chinese) Do you feel safe going back to the place where you live?: Yes      Need for Family Participation in Patient Care: Yes (Comment) Care giver support system in place?: Yes (comment)   Criminal Activity/Legal Involvement Pertinent to Current Situation/Hospitalization: No - Comment as needed  Activities of Daily Living Home Assistive Devices/Equipment: None ADL Screening (condition at time of admission) Patient's cognitive ability adequate to safely complete daily activities?: No (patient currently intubated) Is the patient deaf or have difficulty hearing?: No Does the patient have  difficulty seeing, even when wearing glasses/contacts?: No Does the patient have difficulty concentrating, remembering, or making decisions?: Yes Patient able to express need for assistance with ADLs?: No (patient currently intubated) Does the patient have difficulty dressing or bathing?: Yes Independently performs ADLs?: No Communication: Dependent (patient currently intubated) Is this a change from baseline?: Change from baseline, expected to last >3 days Dressing (OT): Dependent Is this a change from baseline?: Change from baseline, expected to last >3 days Grooming: Dependent Is this a change from baseline?: Change from baseline, expected to last >3 days Feeding: Dependent Is this a change from baseline?: Change from baseline, expected to last >3 days Bathing: Dependent Is this a change from baseline?: Change from baseline, expected to last >3 days Toileting: Dependent Is this a change from baseline?: Change from baseline, expected to last >3days In/Out Bed: Dependent Is this a change from baseline?: Change from baseline, expected to last >3 days Walks in Home: Dependent Is this a change from baseline?: Change from baseline, expected to last >3 days Does the patient have difficulty walking or climbing stairs?: Yes Weakness of Legs: Both Weakness of Arms/Hands: Both  Permission Sought/Granted                  Emotional Assessment Appearance:: Appears stated age Attitude/Demeanor/Rapport: Engaged Affect (typically observed): Calm Orientation: : Oriented to Place, Oriented to Self, Oriented to  Time, Oriented to Situation Alcohol / Substance Use: Not Applicable Psych Involvement:  No (comment)  Admission diagnosis:  Hypocalcemia [E83.51] Hypokalemia [E87.6] Hypernatremia [E87.0] Aspiration pneumonia ([place]) [J69.0] Acute kidney injury ([place]) [N17.9] Acute respiratory failure, unspecified whether with hypoxia or hypercapnia ([place]) [J96.00] Acute hypercapnic respiratory  failure ([place]) [J96.02] Patient Active Problem List   Diagnosis Date Noted  . Protein-calorie malnutrition, severe 04/04/2020  . Acute hypercapnic respiratory failure ([place]) 04/02/2020  . Dehydration 01/24/2020  . Elevated CK 01/24/2020  . Dementia with behavioral disturbance ([place]) 01/24/2020  . SDH (subdural hematoma) ([place]) 01/24/2020  . Parkinson disease ([place]) 01/24/2020  . Abnormal foot color 01/24/2020   PCP:  Kerin Perna, NP Pharmacy:   [facility], [address] [place] [address] Phone: [phone] Fax: [phone]     Social Determinants of Health (SDOH) Interventions    Readmission Risk Interventions No flowsheet data found.

## 2020-04-06 ENCOUNTER — Inpatient Hospital Stay (HOSPITAL_COMMUNITY): Payer: Self-pay

## 2020-04-06 DIAGNOSIS — Z7189 Other specified counseling: Secondary | ICD-10-CM

## 2020-04-06 DIAGNOSIS — E871 Hypo-osmolality and hyponatremia: Secondary | ICD-10-CM

## 2020-04-06 DIAGNOSIS — N179 Acute kidney failure, unspecified: Secondary | ICD-10-CM

## 2020-04-06 LAB — GLUCOSE, CAPILLARY
Glucose-Capillary: 104 mg/dL — ABNORMAL HIGH (ref 70–99)
Glucose-Capillary: 106 mg/dL — ABNORMAL HIGH (ref 70–99)
Glucose-Capillary: 110 mg/dL — ABNORMAL HIGH (ref 70–99)
Glucose-Capillary: 117 mg/dL — ABNORMAL HIGH (ref 70–99)
Glucose-Capillary: 133 mg/dL — ABNORMAL HIGH (ref 70–99)
Glucose-Capillary: 85 mg/dL (ref 70–99)
Glucose-Capillary: 87 mg/dL (ref 70–99)

## 2020-04-06 MED ORDER — FREE WATER
300.0000 mL | Status: DC
  Administered 2020-04-06 – 2020-04-09 (×13): 300 mL

## 2020-04-06 MED ORDER — AMOXICILLIN-POT CLAVULANATE 500-125 MG PO TABS
1.0000 | ORAL_TABLET | Freq: Two times a day (BID) | ORAL | Status: DC
  Administered 2020-04-06: 500 mg via ORAL
  Filled 2020-04-06 (×5): qty 1

## 2020-04-06 NOTE — Progress Notes (Signed)
PROGRESS NOTE    Catherine Alexander  PHX:[id number] DOB: 1951-02-17 DOA: 04/02/2020 PCP: Kerin Perna, NP    Chief Complaint  Patient presents with  . Failure To Thrive    Brief Narrative:  69 y/o female with history of advanced Parkinson dementia admitted from the [facility] emergency department after she was found unresponsive at home on 04/02/2020.  She has a history of advanced Parkinson's disease and has had poor p.o. intake for several weeks prior to admission In the [facility] long emergency department she was noted to be hypotensive, unresponsive, with agonal respirations so she was intubated. She is now extubated, transferred out of ICU  Subjective:  Assessment & Plan:   Active Problems:   Acute hypercapnic respiratory failure (HCC)   Protein-calorie malnutrition, severe   Acute hypoxic respiratory failure with possible aspiration pneumonia -She is now extubated -Was treated with Unasyn switched to Augmentin per tube -She is still to weak to swallow, nutrition and meds per NG tube  Hypernatremia Sodium 171 on presentation, normalized with frequent free water flush through NG tube Change free water flush from every 2 hours to every 4 hours Repeat BMP in the morning  AKI BUN 106 creatinine 2.76 on presentation -Improved BUN 42 creatinine 1.15 today  Severe malnutrition, she is bound and [place] on exam -Continue tube feeds -Goals of care discussion  Failure to thrive, advanced Parkinson's disease -Overall prognosis is very poor, had long discussion with son at bedside for goals of care, son has caregiver exhaustion, his wife has psychiatric disorder not able to help -Patient will benefit from hospice service, son confirmed DNR status and agreed to talk to palliative care  Patient is not [place] citizen, has no insurance, she initially came to [place] after her husband passed away, she was not able to return to [place] since [place] pandemic, now she is  severely debilitated.   DVT prophylaxis: SCDs Start: 04/02/20 1807   Code Status: DNR Family Communication: Son at bedside Disposition:   Status is: Inpatient    Dispo: The patient is from: Home              Anticipated d/c is to: To be determined              Anticipated d/c date is: To be determined                Consultants:   Admitted to ICU, transferred to hospitalist service on November 10  Palliative care  Procedures:   Intubation and extubation  NG tube placement  Antimicrobials:   As above     Objective: Vitals:   04/05/20 1755 04/05/20 2151 04/06/20 0158 04/06/20 0613  BP: (!) 129/28 114/68 (!) 111/52 126/63  Pulse: 96 87 85 91  Resp: 20 16 20 16   Temp: 98.1 F (36.7 C) 98.3 F (36.8 C) 97.8 F (36.6 C) (!) 97.4 F (36.3 C)  TempSrc: Oral Oral Oral Oral  SpO2: 99% 100% 98% 95%  Weight:      Height:        Intake/Output Summary (Last 24 hours) at 04/06/2020 1037 Last data filed at 04/06/2020 0165 Gross per 24 hour  Intake --  Output 1500 ml  Net -1500 ml   Filed Weights   04/04/20 0426 04/05/20 0500  Weight: 33.7 kg 39.3 kg    Examination:  General exam: Very frail, lethargic, nonverbal, opens eyes at times, cachectic, feeding tube in place Respiratory system: Diminished at bases, no wheezing poor  respiratory effort. Cardiovascular system: S1 & S2 heard, RRR.  Trace ankle edema. Gastrointestinal system: Abdomen is nondistended, soft and nontender.  Normal bowel sounds heard. Central nervous system: Very lethargic, does not follow commands Extremities: Generalized weakness, . Skin: No rashes, lesions or ulcers Psychiatry: Very lethargic    Data Reviewed: I have personally reviewed following labs and imaging studies  CBC: Recent Labs  Lab 04/02/20 1623 04/03/20 0300  WBC 12.3* 13.4*  NEUTROABS 10.9*  --   HGB 14.9 14.2  HCT 49.8* 47.2*  MCV 103.8* 103.7*  PLT 130* 121*    Basic Metabolic Panel: Recent Labs  Lab  04/02/20 1623 04/02/20 1807 04/03/20 0300 04/03/20 0300 04/04/20 0012 04/04/20 0247 04/04/20 1805 04/05/20 0243 04/05/20 1241 04/05/20 1707  NA 157*  --  171*  --  148* 146*  --   --  139  --   K <2.0*  --  4.9  --  4.2 4.2  --   --  4.1  --   CL >130*  --  >130*  --  118* 115*  --   --  112*  --   CO2 11*  --  21*  --  20* 16*  --   --  19*  --   GLUCOSE 68*  --  121*  --  171* 156*  --   --  142*  --   BUN 47*  --  106*  --  107* 106*  --   --  42*  --   CREATININE 0.71  --  2.76*  --  2.35* 2.01*  --   --  1.15*  --   CALCIUM <4.0*  --  8.8*  --  7.8* 7.8*  --   --  8.1*  --   MG  --    < > 2.9*   < >  --  2.3 2.2 2.1 2.2 2.2  PHOS  --    < > 3.4   < >  --  2.4* 2.2* 2.7 2.9 2.6   < > = values in this interval not displayed.    GFR: Estimated Creatinine Clearance: 28.6 mL/min (A) (by C-G formula based on SCr of 1.15 mg/dL (H)).  Liver Function Tests: Recent Labs  Lab 04/02/20 1623  AST 13*  ALT 10  ALKPHOS 17*  BILITOT 0.6  PROT <3.0*  ALBUMIN 1.1*    CBG: Recent Labs  Lab 04/05/20 1127 04/05/20 1534 04/05/20 2018 04/06/20 0001 04/06/20 0352  GLUCAP 120* 109* 100* 110* 133*     Recent Results (from the past 240 hour(s))  Culture, blood (routine x 2)     Status: None (Preliminary result)   Collection Time: 04/02/20  4:24 PM   Specimen: BLOOD  Result Value Ref Range Status   Specimen Description   Final    BLOOD RIGHT ANTECUBITAL Performed at [facility], [place] [address] [place], [place] [address]    Special Requests   Final    BOTTLES DRAWN AEROBIC ONLY Blood Culture results may not be optimal due to an inadequate volume of blood received in culture bottles Performed at [facility] [address], [place], [place] [address]    Culture   Final    NO GROWTH 4 DAYS Performed at [facility], [place] [address], [place], [place] [address]    Report Status PENDING  Incomplete  Culture, blood (routine x 2)     Status: None  (Preliminary result)  Collection Time: 04/02/20  4:29 PM   Specimen: BLOOD  Result Value Ref Range Status   Specimen Description   Final    BLOOD RIGHT ANTECUBITAL Performed at [facility] [address] [place], [place] [address]    Special Requests   Final    BOTTLES DRAWN AEROBIC AND ANAEROBIC Blood Culture adequate volume Performed at [facility] [address], [place], [place] [address]    Culture   Final    NO GROWTH 4 DAYS Performed at [facility], [place] [address], [place], [place] [address]    Report Status PENDING  Incomplete  Respiratory Panel by RT PCR (Flu A&B, Covid) - Nasopharyngeal Swab     Status: None   Collection Time: 04/02/20  5:32 PM   Specimen: Nasopharyngeal Swab  Result Value Ref Range Status   SARS Coronavirus 2 by RT PCR NEGATIVE NEGATIVE Final    Comment: (NOTE) SARS-CoV-2 target nucleic acids are NOT DETECTED.  The SARS-CoV-2 RNA is generally detectable in upper respiratoy specimens during the acute phase of infection. The lowest concentration of SARS-CoV-2 viral copies this assay can detect is 131 copies/mL. A negative result does not preclude SARS-Cov-2 infection and should not be used as the sole basis for treatment or other patient management decisions. A negative result may occur with  improper specimen collection/handling, submission of specimen other than nasopharyngeal swab, presence of viral mutation(s) within the areas targeted by this assay, and inadequate number of viral copies (<131 copies/mL). A negative result must be combined with clinical observations, patient history, and epidemiological information. The expected result is Negative.  Fact Sheet for Patients:  PinkCheek.be  Fact Sheet for Healthcare Providers:  GravelBags.it  This test is no t yet approved or cleared by the [place] FDA and  has been authorized for  detection and/or diagnosis of SARS-CoV-2 by FDA under an Emergency Use Authorization (EUA). This EUA will remain  in effect (meaning this test can be used) for the duration of the COVID-19 declaration under Section 564(b)(1) of the Act, 21 U.S.C. section 360bbb-3(b)(1), unless the authorization is terminated or revoked sooner.     Influenza A by PCR NEGATIVE NEGATIVE Final   Influenza B by PCR NEGATIVE NEGATIVE Final    Comment: (NOTE) The Xpert Xpress SARS-CoV-2/FLU/RSV assay is intended as an aid in  the diagnosis of influenza from Nasopharyngeal swab specimens and  should not be used as a sole basis for treatment. Nasal washings and  aspirates are unacceptable for Xpert Xpress SARS-CoV-2/FLU/RSV  testing.  Fact Sheet for Patients: PinkCheek.be  Fact Sheet for Healthcare Providers: GravelBags.it  This test is not yet approved or cleared by the [place] FDA and  has been authorized for detection and/or diagnosis of SARS-CoV-2 by  FDA under an Emergency Use Authorization (EUA). This EUA will remain  in effect (meaning this test can be used) for the duration of the  Covid-19 declaration under Section 564(b)(1) of the Act, 21  U.S.C. section 360bbb-3(b)(1), unless the authorization is  terminated or revoked. Performed at [facility], [place] [address] [place], [place] [address]   MRSA PCR Screening     Status: None   Collection Time: 04/02/20  8:05 PM   Specimen: Nasal Mucosa; Nasopharyngeal  Result Value Ref Range Status   MRSA by PCR NEGATIVE NEGATIVE Final    Comment:        The GeneXpert MRSA Assay (FDA approved for NASAL specimens only), is one  component of a comprehensive MRSA colonization surveillance program. It is not intended to diagnose MRSA infection nor to guide or monitor treatment for MRSA infections. Performed at [facility], [place] [address]  [place], [place] [address]          Radiology Studies: DG Abd 1 View  Result Date: 04/04/2020 CLINICAL DATA:  Check feeding catheter placement EXAM: ABDOMEN - 1 VIEW COMPARISON:  None. FINDINGS: Weighted feeding catheter is noted within the mid stomach. IMPRESSION: Feeding catheter within the stomach. Electronically Signed   By: Inez Catalina M.D.   On: 04/04/2020 14:35        Scheduled Meds: . amoxicillin-clavulanate  1 tablet Oral BID  . chlorhexidine  15 mL Mouth Rinse BID  . Chlorhexidine Gluconate Cloth  6 each Topical Daily  . docusate  100 mg Per Tube BID  . free water  300 mL Per Tube Q4H  . mouth rinse  15 mL Mouth Rinse q12n4p  . polyethylene glycol  17 g Oral Daily  . potassium chloride  40 mEq Oral Once   Continuous Infusions: . dextrose 5 % and 0.2 % NaCl with KCl 20 mEq 50 mL/hr at 04/05/20 0821  . famotidine (PEPCID) IV 20 mg (04/05/20 2213)  . feeding supplement (OSMOLITE 1.2 CAL) 1,000 mL (04/05/20 1709)     LOS: 4 days   Time spent: 77mins Greater than 50% of this time was spent in counseling, explanation of diagnosis, planning of further management, and coordination of care.  I have personally reviewed and interpreted on  04/06/2020 daily labs, I reviewed all nursing notes, pharmacy notes, ICU notes,  vitals, pertinent old records  I have discussed plan of care as described above with RN , patient and family on 04/06/2020  Voice Recognition /Dragon dictation system was used to create this note, attempts have been made to correct errors. Please contact the author with questions and/or clarifications.   Florencia Reasons, MD PhD FACP [facility]  Available via Epic secure chat 7am-7pm for nonurgent issues Please page for urgent issues To page the attending provider between 7A-7P or the covering provider during after hours 7P-7A, please log into the web site [email or web address] and access using universal [place] password for that web site. If you do not have  the password, please call the hospital operator.    04/06/2020, 10:37 AM

## 2020-04-06 NOTE — Progress Notes (Signed)
Upon assessing patient, she sounds congestive in the upper airway, noticed TF spilling out of her left  nostrile where her panda tube placed. Her placement should be at 65 cm but it is currently at 13 cm. TF stopped NP on called paged for KUB.

## 2020-04-06 NOTE — Progress Notes (Signed)
KUB completed, no TF shown in Xray, Panda tube pulled per NP.  RR RN Pamala Hurry and NP on came to see patient. Maintenance IVF infusing via PIV. Patient is resting. Will continue to monitor.

## 2020-04-06 NOTE — Evaluation (Signed)
Physical Therapy Evaluation Patient Details Name: Catherine Alexander MRN: [id number] DOB: 02-07-1951 Today's Date: 04/06/2020   History of Present Illness  69 year old female who has a known history of advanced Parkinson's dementia who was brought to the [facility] emergency department on 04/02/2020 in the setting of being found unresponsive. Pt extubated 04/04/20.  Clinical Impression  Pt requires +2 total assist for bed mobility. She does not follow commands, she did not actively participate with mobility. She is minimally verbal. Son present to interpret. Instructed son in PROM for BUE/LEs. Son reports pt was ambulatory without an assistive device at baseline. He stated he's no longer able to care for his mother at home as he works full time and his wife doesn't provide any care for pt. SNF recommended. PT will sign off as pt is total care at present and unable to participate in PT.     Follow Up Recommendations SNF;Supervision/Assistance - 24 hour    Equipment Recommendations  Hospital bed    Recommendations for Other Services       Precautions / Restrictions Precautions Precautions: Fall Precaution Comments: son is not aware of any falls, pt does have scabs on her knee Restrictions Weight Bearing Restrictions: No      Mobility  Bed Mobility               General bed mobility comments: +2 total assist for rolling, pt 0%    Transfers                    Ambulation/Gait                Stairs            Wheelchair Mobility    Modified Rankin (Stroke Patients Only)       Balance                                             Pertinent Vitals/Pain Pain Assessment: Faces Faces Pain Scale: Hurts little more Pain Location: R shoulder with elevation above 90*, neck with attempted passisve rotation to R Pain Descriptors / Indicators: Grimacing Pain Intervention(s): Limited activity within patient's tolerance;Repositioned    Home  Living Family/patient expects to be discharged to:: Private residence Living Arrangements: Children;Other relatives Available Help at Discharge: Family;Available PRN/intermittently Type of Home: House       Home Layout: Two level Home Equipment: None      Prior Function Level of Independence: Needs assistance   Gait / Transfers Assistance Needed: independent without AD  ADL's / Homemaking Assistance Needed: Pt needs assist with all toileting and ADLs - she wears diaper and family changes - she doesnt say when she needs to toilet        Hand Dominance        Extremity/Trunk Assessment   Upper Extremity Assessment Upper Extremity Assessment: Difficult to assess due to impaired cognition (R shoulder painful with PROM above 90* flexion. B hands edematous.)    Lower Extremity Assessment Lower Extremity Assessment: Difficult to assess due to impaired cognition (BLE PROM is grossly WFL, pt did not respond to commands to actively more UE/LEs. B ankles edematous.)    Cervical / Trunk Assessment Cervical / Trunk Assessment:  (holds head rotated to L in bed, grimmaced with attempted PROM to bring  neck to neutral)  Communication   Communication: Prefers language other  than [place] (son translated)  Cognition Arousal/Alertness: Awake/alert Behavior During Therapy: Flat affect Overall Cognitive Status: History of cognitive impairments - at baseline                                 General Comments: son reports pt is minimally verbal at baseline, and that she is inconsistent with following commands at baseline, today she did not follow commands to wiggle toes/ squeeze my fingers      General Comments      Exercises     Assessment/Plan    PT Assessment Patent does not need any further PT services  PT Problem List         PT Treatment Interventions      PT Goals (Current goals can be found in the Care Plan section)  Acute Rehab PT Goals Patient Stated Goal:  Per son -keep pt comfortable, doesn't feel he can care for pt at home PT Goal Formulation: With family    Frequency     Barriers to discharge        Co-evaluation               AM-PAC PT "6 Clicks" Mobility  Outcome Measure Help needed turning from your back to your side while in a flat bed without using bedrails?: Total Help needed moving from lying on your back to sitting on the side of a flat bed without using bedrails?: Total Help needed moving to and from a bed to a chair (including a wheelchair)?: Total Help needed standing up from a chair using your arms (e.g., wheelchair or bedside chair)?: Total Help needed to walk in hospital room?: Total Help needed climbing 3-5 steps with a railing? : Total 6 Click Score: 6    End of Session   Activity Tolerance: Patient tolerated treatment well Patient left: in bed;with call bell/phone within reach;with family/visitor present Nurse Communication: Mobility status;Need for lift equipment      Time: 1100-1121 PT Time Calculation (min) (ACUTE ONLY): 21 min   Charges:   PT Evaluation $PT Eval Low Complexity: 1 Low          Philomena Doheny PT 04/06/2020  Acute Rehabilitation Services Pager [phone] Office [phone]

## 2020-04-06 NOTE — Progress Notes (Signed)
PHARMACY NOTE:  ANTIMICROBIAL RENAL DOSAGE ADJUSTMENT  Current antimicrobial regimen includes a mismatch between antimicrobial dosage and estimated renal function.  As per policy approved by the Pharmacy & Therapeutics and Medical Executive Committees, the antimicrobial dosage will be adjusted accordingly.  Current antimicrobial dosage:  augmentin 875 mg bid  Indication: PNA  Renal Function:  Estimated Creatinine Clearance: 28.6 mL/min (A) (by C-G formula based on SCr of 1.15 mg/dL (H)). []      On intermittent HD, scheduled: []      On CRRT    Antimicrobial dosage has been changed to:  augmentin 500 mg bid   Thank you for allowing pharmacy to be a part of this patient's care.  Lynelle Doctor, [facility] 04/06/2020 10:14 AM

## 2020-04-06 NOTE — Evaluation (Signed)
SLP Cancellation Note  Patient Details Name: Catherine Alexander MRN: [id number] DOB: [birth year]   Cancelled treatment:       Reason Eval/Treat Not Completed: Other (comment) (order for swallow eval received, no family in room at this time and pt has NG in place, per chart review pt had left home in August 2021 and was found in a bush, Per notes, pt with decline since her spouse passed and she came from [place] to visit her son.  Pt has been declining po intake and is total care per notes in chart review.  RN reports palliative referral pending, SLP will follow up next date (pt has NG for nutrition) in hopes to catch son here got purpose of education/evaluation.  Informed RN of SLP plan. )   [name], MS [facility] SLP [place] Office [phone] Pager [phone]   [name] 04/06/2020, 3:33 PM

## 2020-04-07 ENCOUNTER — Inpatient Hospital Stay (HOSPITAL_COMMUNITY): Payer: Self-pay

## 2020-04-07 ENCOUNTER — Encounter (HOSPITAL_COMMUNITY): Payer: Self-pay | Admitting: Pulmonary Disease

## 2020-04-07 DIAGNOSIS — R52 Pain, unspecified: Secondary | ICD-10-CM

## 2020-04-07 LAB — PROTIME-INR
INR: 1 (ref 0.8–1.2)
Prothrombin Time: 13.2 seconds (ref 11.4–15.2)

## 2020-04-07 LAB — CBC WITH DIFFERENTIAL/PLATELET
Abs Immature Granulocytes: 0.09 10*3/uL — ABNORMAL HIGH (ref 0.00–0.07)
Basophils Absolute: 0 10*3/uL (ref 0.0–0.1)
Basophils Relative: 0 %
Eosinophils Absolute: 0 10*3/uL (ref 0.0–0.5)
Eosinophils Relative: 0 %
HCT: 35.6 % — ABNORMAL LOW (ref 36.0–46.0)
Hemoglobin: 11.6 g/dL — ABNORMAL LOW (ref 12.0–15.0)
Immature Granulocytes: 1 %
Lymphocytes Relative: 3 %
Lymphs Abs: 0.5 10*3/uL — ABNORMAL LOW (ref 0.7–4.0)
MCH: 32 pg (ref 26.0–34.0)
MCHC: 32.6 g/dL (ref 30.0–36.0)
MCV: 98.1 fL (ref 80.0–100.0)
Monocytes Absolute: 0.5 10*3/uL (ref 0.1–1.0)
Monocytes Relative: 4 %
Neutro Abs: 12.2 10*3/uL — ABNORMAL HIGH (ref 1.7–7.7)
Neutrophils Relative %: 92 %
Platelets: 66 10*3/uL — ABNORMAL LOW (ref 150–400)
RBC: 3.63 MIL/uL — ABNORMAL LOW (ref 3.87–5.11)
RDW: 14.4 % (ref 11.5–15.5)
WBC: 13.3 10*3/uL — ABNORMAL HIGH (ref 4.0–10.5)
nRBC: 0 % (ref 0.0–0.2)

## 2020-04-07 LAB — CULTURE, BLOOD (ROUTINE X 2)
Culture: NO GROWTH
Culture: NO GROWTH
Special Requests: ADEQUATE

## 2020-04-07 LAB — GLUCOSE, CAPILLARY
Glucose-Capillary: 87 mg/dL (ref 70–99)
Glucose-Capillary: 87 mg/dL (ref 70–99)
Glucose-Capillary: 84 mg/dL (ref 70–99)
Glucose-Capillary: 84 mg/dL (ref 70–99)
Glucose-Capillary: 119 mg/dL — ABNORMAL HIGH (ref 70–99)

## 2020-04-07 LAB — BASIC METABOLIC PANEL
Anion gap: 7 (ref 5–15)
BUN: 16 mg/dL (ref 8–23)
CO2: 21 mmol/L — ABNORMAL LOW (ref 22–32)
Calcium: 8 mg/dL — ABNORMAL LOW (ref 8.9–10.3)
Chloride: 109 mmol/L (ref 98–111)
Creatinine, Ser: 0.59 mg/dL (ref 0.44–1.00)
GFR, Estimated: >60 mL/min (ref >60)
Glucose, Bld: 95 mg/dL (ref 70–99)
Potassium: 4.2 mmol/L (ref 3.5–5.1)
Sodium: 137 mmol/L (ref 135–145)

## 2020-04-07 LAB — MAGNESIUM: Magnesium: 1.9 mg/dL (ref 1.7–2.4)

## 2020-04-07 MED ORDER — ENOXAPARIN SODIUM 40 MG/0.4ML ~~LOC~~ SOLN
40.0000 mg | Freq: Two times a day (BID) | SUBCUTANEOUS | Status: DC
  Administered 2020-04-07 – 2020-04-12 (×10): 40 mg via SUBCUTANEOUS
  Filled 2020-04-07 (×10): qty 0.4

## 2020-04-07 MED ORDER — ENOXAPARIN SODIUM 300 MG/3ML IJ SOLN: 1.0000 mg/kg | Freq: Two times a day (BID) | INTRAMUSCULAR | Status: DC

## 2020-04-07 MED ORDER — ENOXAPARIN SODIUM 300 MG/3ML IJ SOLN
1.0000 mg/kg | Freq: Two times a day (BID) | INTRAMUSCULAR | Status: DC
  Filled 2020-04-07: qty 0.4

## 2020-04-07 NOTE — Progress Notes (Signed)
Bilateral Lower Ext. study completed.   See CVProc for preliminary results.   Glendal Cassaday, RDMS, RVT 

## 2020-04-07 NOTE — Progress Notes (Deleted)
Left Lower Ext. study completed.   See CVProc for preliminary results.   Blaine Guiffre, RDMS, RVT 

## 2020-04-07 NOTE — Progress Notes (Addendum)
PROGRESS NOTE    Catherine Alexander  YDX:[id number] DOB: 1950/12/27 DOA: 04/02/2020 PCP: Kerin Perna, NP    Chief Complaint  Patient presents with  . Failure To Thrive    Brief Narrative:  69 y/o female with history of advanced Parkinson dementia admitted from the [facility] emergency department after she was found unresponsive at home on 04/02/2020.  She has a history of advanced Parkinson's disease and has had poor p.o. intake for several weeks prior to admission In the [facility] long emergency department she was noted to be hypotensive, unresponsive, with agonal respirations so she was intubated. She is now extubated, transferred out of ICU  Subjective:  She remains very weak Core track feeding tube dislodged last night, she had a brief episode of hypoxia  Assessment & Plan:   Active Problems:   Acute hypercapnic respiratory failure (HCC)   Protein-calorie malnutrition, severe   Acute hypoxic respiratory failure with possible aspiration pneumonia -She is now extubated -Was treated with Unasyn switched to Augmentin per tube -Replace feeding tube -She is still to weak to swallow, nutrition and meds per NG tube  Acute DVT -Start anticoagulation with Lovenox, consider transition to apixaban  Acute thrombocytopenia No sign of bleeding Likely consumption from acute DVT Monitor platelet while on anticoagulation  Hypernatremia Sodium 171 on presentation, normalized with frequent free water flush through NG tube Change free water flush from every 2 hours to every 4 hours Sodium 137 this morning  AKI BUN 106 creatinine 2.76 on presentation -BUN/creatinine normalized this morning  Severe malnutrition, she is bound and Sk[place] on exam -Continue tube feeds -Goals of care discussion  Failure to thrive, advanced Parkinson's disease -Overall prognosis is very poor, had long discussion with son at bedside for goals of care, son has caregiver exhaustion, his wife has  psychiatric disorder not able to help -Patient will benefit from hospice service, son confirmed DNR status and agreed to talk to palliative care  Patient is not [place] citizen, has no insurance, she initially came to [place] after her husband passed away, she was not able to return to [place] since [place] pandemic, now she is severely debilitated.   DVT prophylaxis: SCDs Start: 04/02/20 1807   Code Status: DNR Family Communication: Son at bedside Disposition:   Status is: Inpatient    Dispo: The patient is from: Home              Anticipated d/c is to: To be determined              Anticipated d/c date is: To be determined                Consultants:   Admitted to ICU, transferred to hospitalist service on November 10  Palliative care  Procedures:   Intubation and extubation  NG tube placement  Antimicrobials:   As above     Objective: Vitals:   04/06/20 1352 04/06/20 2021 04/07/20 0411 04/07/20 1312  BP: (!) 130/52 (!) 124/108 127/69 139/62  Pulse: 89 (!) 102 90 77  Resp: 16 20 18 18   Temp: 97.8 F (36.6 C) 98.4 F (36.9 C)  98.1 F (36.7 C)  TempSrc: Oral Oral  Oral  SpO2: 99% 90% 96% 100%  Weight:      Height:        Intake/Output Summary (Last 24 hours) at 04/07/2020 1352 Last data filed at 04/07/2020 1300 Gross per 24 hour  Intake 2074.34 ml  Output 750 ml  Net 1324.34  ml   Filed Weights   04/04/20 0426 04/05/20 0500  Weight: 33.7 kg 39.3 kg    Examination:  General exam: Very frail, lethargic, nonverbal, opens eyes at times, cachectic, feeding tube in place Respiratory system: Diminished at bases, no wheezing poor respiratory effort. Cardiovascular system: S1 & S2 heard, RRR.  Trace ankle edema. Gastrointestinal system: Abdomen is nondistended, soft and nontender.  Normal bowel sounds heard. Central nervous system: Very lethargic, does not follow commands Extremities: Generalized weakness, . Skin: No rashes, lesions or  ulcers Psychiatry: Very lethargic    Data Reviewed: I have personally reviewed following labs and imaging studies  CBC: Recent Labs  Lab 04/02/20 1623 04/03/20 0300 04/07/20 0536  WBC 12.3* 13.4* 13.3*  NEUTROABS 10.9*  --  12.2*  HGB 14.9 14.2 11.6*  HCT 49.8* 47.2* 35.6*  MCV 103.8* 103.7* 98.1  PLT 130* 121* 66*    Basic Metabolic Panel: Recent Labs  Lab 04/03/20 0300 04/03/20 0300 04/04/20 0012 04/04/20 0247 04/04/20 0247 04/04/20 1805 04/05/20 0243 04/05/20 1241 04/05/20 1707 04/07/20 0536  NA 171*  --  148* 146*  --   --   --  139  --  137  K 4.9  --  4.2 4.2  --   --   --  4.1  --  4.2  CL >130*  --  118* 115*  --   --   --  112*  --  109  CO2 21*  --  20* 16*  --   --   --  19*  --  21*  GLUCOSE 121*  --  171* 156*  --   --   --  142*  --  95  BUN 106*  --  107* 106*  --   --   --  42*  --  16  CREATININE 2.76*  --  2.35* 2.01*  --   --   --  1.15*  --  0.59  CALCIUM 8.8*  --  7.8* 7.8*  --   --   --  8.1*  --  8.0*  MG 2.9*   < >  --  2.3   < > 2.2 2.1 2.2 2.2 1.9  PHOS 3.4   < >  --  2.4*  --  2.2* 2.7 2.9 2.6  --    < > = values in this interval not displayed.    GFR: Estimated Creatinine Clearance: 41.2 mL/min (by C-G formula based on SCr of 0.59 mg/dL).  Liver Function Tests: Recent Labs  Lab 04/02/20 1623  AST 13*  ALT 10  ALKPHOS 17*  BILITOT 0.6  PROT <3.0*  ALBUMIN 1.1*    CBG: Recent Labs  Lab 04/06/20 2020 04/06/20 2343 04/07/20 0414 04/07/20 0756 04/07/20 1144  GLUCAP 85 87 87 87 84     Recent Results (from the past 240 hour(s))  Culture, blood (routine x 2)     Status: None   Collection Time: 04/02/20  4:24 PM   Specimen: BLOOD  Result Value Ref Range Status   Specimen Description   Final    BLOOD RIGHT ANTECUBITAL Performed at [facility], [place] [address] [place], [place] [address]    Special Requests   Final    BOTTLES DRAWN AEROBIC ONLY Blood Culture results may not be optimal due to an inadequate  volume of blood received in culture bottles Performed at [place] [address], [place], [place] [address]    Culture  Final    NO GROWTH 5 DAYS Performed at [facility], [place] [address], [place], [place] [address]    Report Status 04/07/2020 FINAL  Final  Culture, blood (routine x 2)     Status: None   Collection Time: 04/02/20  4:29 PM   Specimen: BLOOD  Result Value Ref Range Status   Specimen Description   Final    BLOOD RIGHT ANTECUBITAL Performed at [place] [address] [place], [place] [address]    Special Requests   Final    BOTTLES DRAWN AEROBIC AND ANAEROBIC Blood Culture adequate volume Performed at [place] [address], [place], [place] [address]    Culture   Final    NO GROWTH 5 DAYS Performed at [facility], [place] [address], [place], [place] [address]    Report Status 04/07/2020 FINAL  Final  Respiratory Panel by RT PCR (Flu A&B, Covid) - Nasopharyngeal Swab     Status: None   Collection Time: 04/02/20  5:32 PM   Specimen: Nasopharyngeal Swab  Result Value Ref Range Status   SARS Coronavirus 2 by RT PCR NEGATIVE NEGATIVE Final    Comment: (NOTE) SARS-CoV-2 target nucleic acids are NOT DETECTED.  The SARS-CoV-2 RNA is generally detectable in upper respiratoy specimens during the acute phase of infection. The lowest concentration of SARS-CoV-2 viral copies this assay can detect is 131 copies/mL. A negative result does not preclude SARS-Cov-2 infection and should not be used as the sole basis for treatment or other patient management decisions. A negative result may occur with  improper specimen collection/handling, submission of specimen other than nasopharyngeal swab, presence of viral mutation(s) within the areas targeted by this assay, and inadequate number of viral copies (<131 copies/mL). A negative result must be combined with clinical observations, patient  history, and epidemiological information. The expected result is Negative.  Fact Sheet for Patients:  PinkCheek.be  Fact Sheet for Healthcare Providers:  GravelBags.it  This test is no t yet approved or cleared by the [place] FDA and  has been authorized for detection and/or diagnosis of SARS-CoV-2 by FDA under an Emergency Use Authorization (EUA). This EUA will remain  in effect (meaning this test can be used) for the duration of the COVID-19 declaration under Section 564(b)(1) of the Act, 21 U.S.C. section 360bbb-3(b)(1), unless the authorization is terminated or revoked sooner.     Influenza A by PCR NEGATIVE NEGATIVE Final   Influenza B by PCR NEGATIVE NEGATIVE Final    Comment: (NOTE) The Xpert Xpress SARS-CoV-2/FLU/RSV assay is intended as an aid in  the diagnosis of influenza from Nasopharyngeal swab specimens and  should not be used as a sole basis for treatment. Nasal washings and  aspirates are unacceptable for Xpert Xpress SARS-CoV-2/FLU/RSV  testing.  Fact Sheet for Patients: PinkCheek.be  Fact Sheet for Healthcare Providers: GravelBags.it  This test is not yet approved or cleared by the [place] FDA and  has been authorized for detection and/or diagnosis of SARS-CoV-2 by  FDA under an Emergency Use Authorization (EUA). This EUA will remain  in effect (meaning this test can be used) for the duration of the  Covid-19 declaration under Section 564(b)(1) of the Act, 21  U.S.C. section 360bbb-3(b)(1), unless the authorization is  terminated or revoked. Performed at [facility], [place] [address] [place], [place] [address]   MRSA PCR Screening     Status: None   Collection Time: 04/02/20  8:05 PM   Specimen: Nasal Mucosa; Nasopharyngeal  Result Value Ref Range Status   MRSA by PCR NEGATIVE NEGATIVE Final    Comment:         The GeneXpert MRSA Assay (FDA approved for NASAL specimens only), is one component of a comprehensive MRSA colonization surveillance program. It is not intended to diagnose MRSA infection nor to guide or monitor treatment for MRSA infections. Performed at [facility], [place] [address] [place], [place] [address]          Radiology Studies: DG Abd 1 View  Result Date: 04/06/2020 CLINICAL DATA:  Enteric catheter placement EXAM: ABDOMEN - 1 VIEW COMPARISON:  04/04/2020 FINDINGS: Semi-erect frontal view of the lower chest and upper abdomen was performed. I do not seen enteric catheter on the included portions of the lower chest or upper abdomen. Dedicated chest x-ray may be useful to evaluate for enteric catheter malpositioning. Bowel gas pattern is unremarkable. Multifocal airspace disease is seen at the lung bases, which has progressed since prior study. IMPRESSION: 1. Enteric catheter is not identified on this exam. Chest x-ray may be useful to assess for catheter malpositioning. 2. Worsening multifocal bilateral pneumonia. These results will be called to the ordering clinician or representative by the Radiologist Assistant, and communication documented in the PACS or [employer]. Electronically Signed   By: Randa Ngo M.D.   On: 04/06/2020 21:47   VAS [place] LOWER EXTREMITY VENOUS (DVT)  Result Date: 04/07/2020  Lower Venous DVT Study Indications: Pain.  Risk Factors: Immobility. Performing Technologist: Griffin Basil RCT RDMS  Examination Guidelines: A complete evaluation includes B-mode imaging, spectral Doppler, color Doppler, and power Doppler as needed of all accessible portions of each vessel. Bilateral testing is considered an integral part of a complete examination. Limited examinations for reoccurring indications may be performed as noted. The reflux portion of the exam is performed with the patient in reverse Trendelenburg.   +---------+---------------+---------+-----------+----------+--------------+ RIGHT    CompressibilityPhasicitySpontaneityPropertiesThrombus Aging +---------+---------------+---------+-----------+----------+--------------+ CFV      Full           Yes      Yes                                 +---------+---------------+---------+-----------+----------+--------------+ SFJ      Full                                                        +---------+---------------+---------+-----------+----------+--------------+ FV Prox  Full                                                        +---------+---------------+---------+-----------+----------+--------------+ FV Mid   Full                                                        +---------+---------------+---------+-----------+----------+--------------+ FV DistalFull                                                        +---------+---------------+---------+-----------+----------+--------------+  PFV      Full                                                        +---------+---------------+---------+-----------+----------+--------------+ POP      Full           Yes      Yes                                 +---------+---------------+---------+-----------+----------+--------------+ PTV      Full                                                        +---------+---------------+---------+-----------+----------+--------------+   +---------+---------------+---------+-----------+---------------+--------------+ LEFT     CompressibilityPhasicitySpontaneityProperties     Thrombus Aging +---------+---------------+---------+-----------+---------------+--------------+ CFV      Full           Yes      Yes                                      +---------+---------------+---------+-----------+---------------+--------------+ SFJ      Full                                                              +---------+---------------+---------+-----------+---------------+--------------+ FV Prox  Full                                                             +---------+---------------+---------+-----------+---------------+--------------+ FV Mid   Full                                                             +---------+---------------+---------+-----------+---------------+--------------+ FV DistalFull                                                             +---------+---------------+---------+-----------+---------------+--------------+ PFV      Full                                                             +---------+---------------+---------+-----------+---------------+--------------+ POP      Full  Yes      Yes                                      +---------+---------------+---------+-----------+---------------+--------------+ PTV      None                               softly         Acute                                                      echogenic                     +---------+---------------+---------+-----------+---------------+--------------+ PERO     Full                                                             +---------+---------------+---------+-----------+---------------+--------------+   Left Technical Findings: 1 posterior Tibial Vein Thrombus   Summary: RIGHT: - There is no evidence of deep vein thrombosis in the lower extremity.  - No cystic structure found in the popliteal fossa.  LEFT: - Findings consistent with acute deep vein thrombosis involving the left posterior tibial veins. - No cystic structure found in the popliteal fossa.  *See table(s) above for measurements and observations.    Preliminary         Scheduled Meds: . amoxicillin-clavulanate  1 tablet Oral BID  . chlorhexidine  15 mL Mouth Rinse BID  . docusate  100 mg Per Tube BID  . free water  300 mL Per Tube Q4H  . mouth rinse  15 mL Mouth Rinse  [id number]  . polyethylene glycol  17 g Oral Daily  . potassium chloride  40 mEq Oral Once   Continuous Infusions: . dextrose 5 % and 0.2 % NaCl with KCl 20 mEq 50 mL/hr at 04/05/20 0821  . famotidine (PEPCID) IV 20 mg (04/06/20 2246)  . feeding supplement (OSMOLITE 1.2 CAL) 1,000 mL (04/05/20 1709)     LOS: 5 days   Time spent: 48mins, continued goals of care discussion with son at bedside today Greater than 50% of this time was spent in counseling, explanation of diagnosis, planning of further management, and coordination of care.  I have personally reviewed and interpreted on  04/07/2020 daily labs, I reviewed all nursing notes, pharmacy notes, ICU notes,  vitals, pertinent old records  I have discussed plan of care as described above with RN , patient and family on 04/07/2020  Voice Recognition /Dragon dictation system was used to create this note, attempts have been made to correct errors. Please contact the author with questions and/or clarifications.   Florencia Reasons, MD PhD FACP [facility]  Available via Epic secure chat 7am-7pm for nonurgent issues Please page for urgent issues To page the attending provider between 7A-7P or the covering provider during after hours 7P-7A, please log into the web site [email or web address] and access using universal [place] password for that web site. If you do not have the  password, please call the hospital operator.    04/07/2020, 1:52 PM

## 2020-04-07 NOTE — Progress Notes (Addendum)
ANTICOAGULATION CONSULT NOTE  Pharmacy Consult for Enoxaparin Indication: DVT  No Known Allergies  Patient Measurements: Height: 5\' 4"  (162.6 cm) Weight: 39.3 kg (86 lb 10.3 oz) IBW/kg (Calculated) : 54.7 Heparin Dosing Weight: n/a  Vital Signs: Temp: 98.1 F (36.7 C) (11/11 1312) Temp Source: Oral (11/11 1312) BP: 139/62 (11/11 1312) Pulse Rate: 77 (11/11 1312)  Labs: Recent Labs    04/05/20 1241 04/07/20 0536 04/07/20 1525  HGB  --  11.6*  --   HCT  --  35.6*  --   PLT  --  66*  --   LABPROT  --   --  13.2  INR  --   --  1.0  CREATININE 1.15* 0.59  --     Estimated Creatinine Clearance: 41.2 mL/min (by C-G formula based on SCr of 0.59 mg/dL).   Medical History: Past Medical History:  Diagnosis Date  . Dementia with behavioral disturbance ([place]) 01/24/2020    Medications:  No medications prior to admission.   Scheduled:  . amoxicillin-clavulanate  1 tablet Oral BID  . chlorhexidine  15 mL Mouth Rinse BID  . docusate  100 mg Per Tube BID  . enoxaparin (LOVENOX) injection  40 mg Subcutaneous Q12H  . free water  300 mL Per Tube Q4H  . mouth rinse  15 mL Mouth Rinse q12n4p  . polyethylene glycol  17 g Oral Daily  . potassium chloride  40 mEq Oral Once   Infusions:  . dextrose 5 % and 0.2 % NaCl with KCl 20 mEq 50 mL/hr at 04/05/20 0821  . famotidine (PEPCID) IV 20 mg (04/06/20 2246)  . feeding supplement (OSMOLITE 1.2 CAL) 1,000 mL (04/05/20 1709)   PRN: acetaminophen, docusate sodium, fentaNYL (SUBLIMAZE) injection, fentaNYL (SUBLIMAZE) injection, midazolam, midazolam, ondansetron (ZOFRAN) IV, polyethylene glycol  Assessment: 69 y.o. female admitted 04/02/2020 after being found down at home, PMH advanced Parkinson's/dementia, FTT. Bilat LE doppler shows L DVT. Pharmacy to dose Lovenox.   CBC: Hgb and Plt both low; Plt remains > 50k for now  SCr: AKI on admission, now resolved  Previous anticoagulation: none  Note INR 4.2 on admission; now normal on  recheck   Goal of Therapy: Anti-Xa level 0.6-1 units/ml 4hrs after LMWH dose given  Plan:  Lovenox 40 mg (1 mg/kg) SQ q12 hr  Daily CBC while Hgb/plt acutely dropping  Monitor for signs of bleeding or worsening thrombosis   Reuel Boom, PharmD, BCPS [phone] 04/07/2020, 4:46 PM

## 2020-04-07 NOTE — Progress Notes (Signed)
MD Erlinda Hong ordered for panda tube to be placed. Panda tube placed in left nare, X-ray order placed and confirmed placement. Stylet wire removed.

## 2020-04-07 NOTE — Evaluation (Addendum)
SLP Cancellation Note  Patient Details Name: Catherine Alexander MRN: [id number] DOB: 07/29/1950   Cancelled treatment:       Reason Eval/Treat Not Completed: Other (comment) (spoke to MD who agreed to hold on swallow eval until palliative meeting to determine [place])   [name] 04/07/2020, 11:41 AM   [name], MS [facility] Office [phone] Pager [phone]

## 2020-04-08 DIAGNOSIS — R531 Weakness: Secondary | ICD-10-CM

## 2020-04-08 DIAGNOSIS — Z515 Encounter for palliative care: Secondary | ICD-10-CM

## 2020-04-08 LAB — GLUCOSE, CAPILLARY
Glucose-Capillary: 107 mg/dL — ABNORMAL HIGH (ref 70–99)
Glucose-Capillary: 121 mg/dL — ABNORMAL HIGH (ref 70–99)
Glucose-Capillary: 126 mg/dL — ABNORMAL HIGH (ref 70–99)
Glucose-Capillary: 137 mg/dL — ABNORMAL HIGH (ref 70–99)
Glucose-Capillary: 138 mg/dL — ABNORMAL HIGH (ref 70–99)
Glucose-Capillary: 92 mg/dL (ref 70–99)

## 2020-04-08 LAB — CBC
HCT: 34.5 % — ABNORMAL LOW (ref 36.0–46.0)
Hemoglobin: 11.1 g/dL — ABNORMAL LOW (ref 12.0–15.0)
MCH: 30.8 pg (ref 26.0–34.0)
MCHC: 32.2 g/dL (ref 30.0–36.0)
MCV: 95.8 fL (ref 80.0–100.0)
Platelets: 94 10*3/uL — ABNORMAL LOW (ref 150–400)
RBC: 3.6 MIL/uL — ABNORMAL LOW (ref 3.87–5.11)
RDW: 14.1 % (ref 11.5–15.5)
WBC: 9.2 10*3/uL (ref 4.0–10.5)
nRBC: 0 % (ref 0.0–0.2)

## 2020-04-08 LAB — BASIC METABOLIC PANEL
Anion gap: 6 (ref 5–15)
BUN: 15 mg/dL (ref 8–23)
CO2: 24 mmol/L (ref 22–32)
Calcium: 8.2 mg/dL — ABNORMAL LOW (ref 8.9–10.3)
Chloride: 108 mmol/L (ref 98–111)
Creatinine, Ser: 0.52 mg/dL (ref 0.44–1.00)
GFR, Estimated: >60 mL/min (ref >60)
Glucose, Bld: 117 mg/dL — ABNORMAL HIGH (ref 70–99)
Potassium: 4.2 mmol/L (ref 3.5–5.1)
Sodium: 138 mmol/L (ref 135–145)

## 2020-04-08 LAB — VITAMIN B12: Vitamin B-12: 1506 pg/mL — ABNORMAL HIGH (ref 180–914)

## 2020-04-08 LAB — MAGNESIUM: Magnesium: 1.9 mg/dL (ref 1.7–2.4)

## 2020-04-08 LAB — FOLATE: Folate: 8.1 ng/mL (ref >5.9)

## 2020-04-08 MED ORDER — AMOXICILLIN-POT CLAVULANATE 500-125 MG PO TABS
1.0000 | ORAL_TABLET | Freq: Three times a day (TID) | ORAL | Status: DC
  Administered 2020-04-08 – 2020-04-09 (×3): 500 mg via ORAL
  Filled 2020-04-08 (×4): qty 1

## 2020-04-08 MED ORDER — LIP MEDEX EX OINT
TOPICAL_OINTMENT | CUTANEOUS | Status: AC
  Filled 2020-04-08: qty 7

## 2020-04-08 NOTE — Progress Notes (Signed)
PHARMACY NOTE:  ANTIMICROBIAL RENAL DOSAGE ADJUSTMENT  Current antimicrobial regimen includes a mismatch between antimicrobial dosage and estimated renal function.  As per policy approved by the Pharmacy & Therapeutics and Medical Executive Committees, the antimicrobial dosage will be adjusted accordingly.  Current antimicrobial dosage:  augmentin 500mg  bid  Indication: PNA  Renal Function:  Estimated Creatinine Clearance: 41.2 mL/min (by C-G formula based on SCr of 0.52 mg/dL). []      On intermittent HD, scheduled: []      On CRRT    Antimicrobial dosage has been changed to:  augmentin 500 mg TID   Thank you for allowing pharmacy to be a part of this patient's care.  Lynelle Doctor, [facility] 04/08/2020 8:06 AM

## 2020-04-08 NOTE — Progress Notes (Signed)
PROGRESS NOTE    Catherine Alexander  IDP:[id number] DOB: [birth year] DOA: 04/02/2020 PCP: Kerin Perna, NP    Chief Complaint  Patient presents with  . Failure To Thrive    Brief Narrative:  69 y/o female with history of advanced Parkinson dementia admitted from the [facility] emergency department after she was found unresponsive at home on 04/02/2020.  She has a history of advanced Parkinson's disease and has had poor p.o. intake for several weeks prior to admission In the [facility] long emergency department she was noted to be hypotensive, unresponsive, with agonal respirations so she was intubated. She is now extubated, transferred out of ICU  Subjective:  She  Appears more awake, start to track with her eyes   Assessment & Plan:   Active Problems:   Acute hypercapnic respiratory failure (HCC)   Protein-calorie malnutrition, severe   Acute hypoxic respiratory failure with possible aspiration pneumonia -Intubated in the ED and admitted to ICU ,she is now extubated -Was treated with Unasyn switched to Augmentin per tube -Replaced temporary feeding tube - nutrition and meds per NG tube, speech eval  Acute DVT -Start anticoagulation with Lovenox, consider transition to apixaban   Acute thrombocytopenia No sign of bleeding Likely consumption from acute DVT Monitor platelet while on anticoagulation, slightly improved today  Hypernatremia Sodium 171 on presentation, normalized with frequent free water flush through NG tube Change free water flush from every 2 hours to every 4 hours Sodium 137 this morning  AKI BUN 106 creatinine 2.76 on presentation -BUN/creatinine normalized this morning  Severe malnutrition, she is bound and [place] on exam -Continue tube feeds -Goals of care discussion  Failure to thrive, advanced Parkinson's disease -Overall prognosis is very poor, had long discussion with son at bedside for goals of care, son has caregiver exhaustion, his wife has  psychiatric disorder not able to help -Patient will benefit from hospice service, son confirmed DNR status and agreed to talk to palliative care  Patient is not [place] citizen, has no insurance, she initially came to [place] after her husband passed away, she was not able to return to [place] since [place] pandemic, now she is severely debilitated. Case manager help with urgent Medicaid application   DVT prophylaxis: enoxaparin (LOVENOX) injection 40 mg Start: 04/07/20 1800 SCDs Start: 04/02/20 1807   Code Status: DNR Family Communication: Son at bedside Disposition:   Status is: Inpatient    Dispo: The patient is from: Home              Anticipated d/c is to: To be determined              Anticipated d/c date is: To be determined                Consultants:   Admitted to ICU, transferred to hospitalist service on November 10  Palliative care  Procedures:   Intubation and extubation  NG tube placement  Antimicrobials:   As above     Objective: Vitals:   04/07/20 0411 04/07/20 1312 04/07/20 1947 04/08/20 0358  BP: 127/69 139/62 (!) 141/88 126/70  Pulse: 90 77 (!) 102 88  Resp: 18 18  20   Temp:  98.1 F (36.7 C)    TempSrc:  Oral    SpO2: 96% 100% 100% 100%  Weight:      Height:        Intake/Output Summary (Last 24 hours) at 04/08/2020 0920 Last data filed at 04/07/2020 1534 Gross per 24 hour  Intake 0 ml  Output 600 ml  Net -600 ml   Filed Weights   04/04/20 0426 04/05/20 0500  Weight: 33.7 kg 39.3 kg    Examination:  General exam: Very frail, lethargic, nonverbal, opens eyes at times, cachectic, feeding tube in place Respiratory system: Diminished at bases, no wheezing poor respiratory effort. Cardiovascular system: S1 & S2 heard, RRR.  Trace ankle edema. Gastrointestinal system: Abdomen is nondistended, soft and nontender.  Normal bowel sounds heard. Central nervous system: Very lethargic, does not follow commands Extremities:  Generalized weakness, . Skin: No rashes, lesions or ulcers Psychiatry: Very lethargic    Data Reviewed: I have personally reviewed following labs and imaging studies  CBC: Recent Labs  Lab 04/02/20 1623 04/03/20 0300 04/07/20 0536 04/08/20 0518  WBC 12.3* 13.4* 13.3* 9.2  NEUTROABS 10.9*  --  12.2*  --   HGB 14.9 14.2 11.6* 11.1*  HCT 49.8* 47.2* 35.6* 34.5*  MCV 103.8* 103.7* 98.1 95.8  PLT 130* 121* 66* 94*    Basic Metabolic Panel: Recent Labs  Lab 04/03/20 0300 04/04/20 0012 04/04/20 0247 04/04/20 0247 04/04/20 1805 04/04/20 1805 04/05/20 0243 04/05/20 1241 04/05/20 1707 04/07/20 0536 04/08/20 0518  NA  --  148* 146*  --   --   --   --  139  --  137 138  K  --  4.2 4.2  --   --   --   --  4.1  --  4.2 4.2  CL  --  118* 115*  --   --   --   --  112*  --  109 108  CO2  --  20* 16*  --   --   --   --  19*  --  21* 24  GLUCOSE  --  171* 156*  --   --   --   --  142*  --  95 117*  BUN  --  107* 106*  --   --   --   --  42*  --  16 15  CREATININE  --  2.35* 2.01*  --   --   --   --  1.15*  --  0.59 0.52  CALCIUM  --  7.8* 7.8*  --   --   --   --  8.1*  --  8.0* 8.2*  MG   < >  --  2.3   < > 2.2   < > 2.1 2.2 2.2 1.9 1.9  PHOS   < >  --  2.4*  --  2.2*  --  2.7 2.9 2.6  --   --    < > = values in this interval not displayed.    GFR: Estimated Creatinine Clearance: 41.2 mL/min (by C-G formula based on SCr of 0.52 mg/dL).  Liver Function Tests: Recent Labs  Lab 04/02/20 1623  AST 13*  ALT 10  ALKPHOS 17*  BILITOT 0.6  PROT <3.0*  ALBUMIN 1.1*    CBG: Recent Labs  Lab 04/07/20 1144 04/07/20 1717 04/07/20 2024 04/08/20 0040 04/08/20 0400  GLUCAP 84 84 119* 126* 107*     Recent Results (from the past 240 hour(s))  Culture, blood (routine x 2)     Status: None   Collection Time: 04/02/20  4:24 PM   Specimen: BLOOD  Result Value Ref Range Status   Specimen Description   Final    BLOOD RIGHT ANTECUBITAL Performed at [facility],  [place]  [address], [place], [place] [address]    Special Requests   Final    BOTTLES DRAWN AEROBIC ONLY Blood Culture results may not be optimal due to an inadequate volume of blood received in culture bottles Performed at [facility] [address] [place], [place] [address]    Culture   Final    NO GROWTH 5 DAYS Performed at [facility], [place] [address], [place], [place] [address]    Report Status 04/07/2020 FINAL  Final  Culture, blood (routine x 2)     Status: None   Collection Time: 04/02/20  4:29 PM   Specimen: BLOOD  Result Value Ref Range Status   Specimen Description   Final    BLOOD RIGHT ANTECUBITAL Performed at [facility] [address] [place], [place] [address]    Special Requests   Final    BOTTLES DRAWN AEROBIC AND ANAEROBIC Blood Culture adequate volume Performed at [facility] [address] [place], [place] [address]    Culture   Final    NO GROWTH 5 DAYS Performed at [facility], [place] [address] [place], [place] [address]    Report Status 04/07/2020 FINAL  Final  Respiratory Panel by RT PCR (Flu A&B, Covid) - Nasopharyngeal Swab     Status: None   Collection Time: 04/02/20  5:32 PM   Specimen: Nasopharyngeal Swab  Result Value Ref Range Status   SARS Coronavirus 2 by RT PCR NEGATIVE NEGATIVE Final    Comment: (NOTE) SARS-CoV-2 target nucleic acids are NOT DETECTED.  The SARS-CoV-2 RNA is generally detectable in upper respiratoy specimens during the acute phase of infection. The lowest concentration of SARS-CoV-2 viral copies this assay can detect is 131 copies/mL. A negative result does not preclude SARS-Cov-2 infection and should not be used as the sole basis for treatment or other patient management decisions. A negative result may occur with  improper specimen collection/handling, submission of specimen other than nasopharyngeal swab, presence of viral mutation(s) within  the areas targeted by this assay, and inadequate number of viral copies (<131 copies/mL). A negative result must be combined with clinical observations, patient history, and epidemiological information. The expected result is Negative.  Fact Sheet for Patients:  PinkCheek.be  Fact Sheet for Healthcare Providers:  GravelBags.it  This test is no t yet approved or cleared by the [place] FDA and  has been authorized for detection and/or diagnosis of SARS-CoV-2 by FDA under an Emergency Use Authorization (EUA). This EUA will remain  in effect (meaning this test can be used) for the duration of the COVID-19 declaration under Section 564(b)(1) of the Act, 21 U.S.C. section 360bbb-3(b)(1), unless the authorization is terminated or revoked sooner.     Influenza A by PCR NEGATIVE NEGATIVE Final   Influenza B by PCR NEGATIVE NEGATIVE Final    Comment: (NOTE) The Xpert Xpress SARS-CoV-2/FLU/RSV assay is intended as an aid in  the diagnosis of influenza from Nasopharyngeal swab specimens and  should not be used as a sole basis for treatment. Nasal washings and  aspirates are unacceptable for Xpert Xpress SARS-CoV-2/FLU/RSV  testing.  Fact Sheet for Patients: PinkCheek.be  Fact Sheet for Healthcare Providers: GravelBags.it  This test is not yet approved or cleared by the [place] FDA and  has been authorized for detection and/or diagnosis of SARS-CoV-2 by  FDA under an Emergency Use Authorization (EUA). This EUA will remain  in effect (meaning this test can be used) for  the duration of the  Covid-19 declaration under Section 564(b)(1) of the Act, 21  U.S.C. section 360bbb-3(b)(1), unless the authorization is  terminated or revoked. Performed at [facility], [place] [address], [place], [place] [address]   MRSA PCR Screening     Status: None    Collection Time: 04/02/20  8:05 PM   Specimen: Nasal Mucosa; Nasopharyngeal  Result Value Ref Range Status   MRSA by PCR NEGATIVE NEGATIVE Final    Comment:        The GeneXpert MRSA Assay (FDA approved for NASAL specimens only), is one component of a comprehensive MRSA colonization surveillance program. It is not intended to diagnose MRSA infection nor to guide or monitor treatment for MRSA infections. Performed at [facility], [place] [address], [place], [place] [address]          Radiology Studies: DG Abd 1 View  Result Date: 04/06/2020 CLINICAL DATA:  Enteric catheter placement EXAM: ABDOMEN - 1 VIEW COMPARISON:  04/04/2020 FINDINGS: Semi-erect frontal view of the lower chest and upper abdomen was performed. I do not seen enteric catheter on the included portions of the lower chest or upper abdomen. Dedicated chest x-ray may be useful to evaluate for enteric catheter malpositioning. Bowel gas pattern is unremarkable. Multifocal airspace disease is seen at the lung bases, which has progressed since prior study. IMPRESSION: 1. Enteric catheter is not identified on this exam. Chest x-ray may be useful to assess for catheter malpositioning. 2. Worsening multifocal bilateral pneumonia. These results will be called to the ordering clinician or representative by the Radiologist Assistant, and communication documented in the PACS or [employer]. Electronically Signed   By: Randa Ngo M.D.   On: 04/06/2020 21:47   DG CHEST PORT 1 VIEW  Result Date: 04/07/2020 CLINICAL DATA:  Possible aspiration. EXAM: PORTABLE CHEST 1 VIEW COMPARISON:  April 04, 2020. FINDINGS: Stable cardiomediastinal silhouette. No pneumothorax is noted. Increased bibasilar opacities are noted concerning for possible pneumonia or edema. Small left pleural effusion may be present. Endotracheal and nasogastric tubes have been removed. Bony thorax is unremarkable. IMPRESSION: Increased bibasilar  opacities are noted concerning for possible pneumonia or edema. Small left pleural effusion may be present. Endotracheal and nasogastric tubes have been removed. Electronically Signed   By: Marijo Conception M.D.   On: 04/07/2020 14:21   DG Abd Portable 1V  Result Date: 04/07/2020 CLINICAL DATA:  Feeding tube placement EXAM: PORTABLE ABDOMEN - 1 VIEW COMPARISON:  04/06/2020 FINDINGS: Interval placement of weighted enteric feeding tube, tip positioned below the diaphragm near the gastric pylorus. Metallic stylette remains in position. Nonobstructive pattern of bowel gas. IMPRESSION: Interval placement of weighted enteric feeding tube, tip positioned below the diaphragm near the gastric pylorus. Metallic stylette remains in position. Electronically Signed   By: Eddie Candle M.D.   On: 04/07/2020 15:05   VAS [place] LOWER EXTREMITY VENOUS (DVT)  Result Date: 04/07/2020  Lower Venous DVT Study Indications: Pain.  Risk Factors: Immobility. Performing Technologist: Griffin Basil RCT RDMS  Examination Guidelines: A complete evaluation includes B-mode imaging, spectral Doppler, color Doppler, and power Doppler as needed of all accessible portions of each vessel. Bilateral testing is considered an integral part of a complete examination. Limited examinations for reoccurring indications may be performed as noted. The reflux portion of the exam is performed with the patient in reverse Trendelenburg.  +---------+---------------+---------+-----------+----------+--------------+ RIGHT    CompressibilityPhasicitySpontaneityPropertiesThrombus Aging +---------+---------------+---------+-----------+----------+--------------+ CFV      Full  Yes      Yes                                 +---------+---------------+---------+-----------+----------+--------------+ SFJ      Full                                                        +---------+---------------+---------+-----------+----------+--------------+  FV Prox  Full                                                        +---------+---------------+---------+-----------+----------+--------------+ FV Mid   Full                                                        +---------+---------------+---------+-----------+----------+--------------+ FV DistalFull                                                        +---------+---------------+---------+-----------+----------+--------------+ PFV      Full                                                        +---------+---------------+---------+-----------+----------+--------------+ POP      Full           Yes      Yes                                 +---------+---------------+---------+-----------+----------+--------------+ PTV      Full                                                        +---------+---------------+---------+-----------+----------+--------------+   +---------+---------------+---------+-----------+---------------+--------------+ LEFT     CompressibilityPhasicitySpontaneityProperties     Thrombus Aging +---------+---------------+---------+-----------+---------------+--------------+ CFV      Full           Yes      Yes                                      +---------+---------------+---------+-----------+---------------+--------------+ SFJ      Full                                                             +---------+---------------+---------+-----------+---------------+--------------+  FV Prox  Full                                                             +---------+---------------+---------+-----------+---------------+--------------+ FV Mid   Full                                                             +---------+---------------+---------+-----------+---------------+--------------+ FV DistalFull                                                              +---------+---------------+---------+-----------+---------------+--------------+ PFV      Full                                                             +---------+---------------+---------+-----------+---------------+--------------+ POP      Full           Yes      Yes                                      +---------+---------------+---------+-----------+---------------+--------------+ PTV      None                               softly         Acute                                                      echogenic                     +---------+---------------+---------+-----------+---------------+--------------+ PERO     Full                                                             +---------+---------------+---------+-----------+---------------+--------------+   Left Technical Findings: 1 posterior Tibial Vein Thrombus   Summary: RIGHT: - There is no evidence of deep vein thrombosis in the lower extremity.  - No cystic structure found in the popliteal fossa.  LEFT: - Findings consistent with acute deep vein thrombosis involving the left posterior tibial veins. - No cystic structure found in the popliteal fossa.  *See table(s) above for measurements and observations. Electronically signed by Monica Martinez MD on 04/07/2020 at 4:20:05 PM.    Final  Scheduled Meds: . amoxicillin-clavulanate  1 tablet Oral TID  . chlorhexidine  15 mL Mouth Rinse BID  . docusate  100 mg Per Tube BID  . enoxaparin (LOVENOX) injection  40 mg Subcutaneous Q12H  . free water  300 mL Per Tube Q4H  . mouth rinse  15 mL Mouth Rinse q12n4p  . polyethylene glycol  17 g Oral Daily  . potassium chloride  40 mEq Oral Once   Continuous Infusions: . dextrose 5 % and 0.2 % NaCl with KCl 20 mEq 50 mL/hr at 04/05/20 0821  . famotidine (PEPCID) IV 20 mg (04/07/20 2109)  . feeding supplement (OSMOLITE 1.2 CAL) 1,000 mL (04/05/20 1709)     LOS: 6 days   Time spent: 29mins, continued  goals of care discussion with son at bedside today, discussed with palliative care Greater than 50% of this time was spent in counseling, explanation of diagnosis, planning of further management, and coordination of care.  I have personally reviewed and interpreted on  04/08/2020 daily labs, I reviewed all nursing notes, pharmacy notes, ICU notes,  vitals, pertinent old records  I have discussed plan of care as described above with RN , patient and family on 04/08/2020  Voice Recognition /Dragon dictation system was used to create this note, attempts have been made to correct errors. Please contact the author with questions and/or clarifications.   Florencia Reasons, MD PhD FACP [facility]  Available via Epic secure chat 7am-7pm for nonurgent issues Please page for urgent issues To page the attending provider between 7A-7P or the covering provider during after hours 7P-7A, please log into the web site [email or web address] and access using universal [place] password for that web site. If you do not have the password, please call the hospital operator.    04/08/2020, 9:20 AM

## 2020-04-08 NOTE — Evaluation (Signed)
Clinical/Bedside Swallow Evaluation Patient Details  Name: Catherine Alexander MRN: [id number] Date of Birth: [birth year]  Today's Date: 04/08/2020 Time: SLP Start Time (ACUTE ONLY): 1004 SLP Stop Time (ACUTE ONLY): 1035 SLP Time Calculation (min) (ACUTE ONLY): 31 min  Past Medical History:  Past Medical History:  Diagnosis Date  . Dementia with behavioral disturbance ([place]) 01/24/2020   Past Surgical History: History reviewed. No pertinent surgical history. HPI:  69 year old female who has a known history of advanced Parkinson's dementia found unresponsive. Pt intubated 11/6-11/8/21. Found to have acute hypoxic respiratory failure with probable aspiration pneumonia, acute DVT, thrombocytopenia, hypernatremia, AKI and severe malnutrition.    Assessment / Plan / Recommendation Clinical Impression  Pt is severely deconditioned, cachectic, with her head rotated to left and son present and translating. She followed commands (delayed) revealing weak and limited labial/lingual ROM. Respiratory drive diminished producing subtle throat clear on command. She appears unable to protect airway upon clincial observation with thin and nectar consistencies marked by reduced laryngeal elevation, audible/sluggish/dyscoordinated, multiple swallows immediate coughs and throat clears. Discussion with son re: desires for nutrition and options moving forward. In light of current presentation and clinical impression of swallow with likely outcome, pt's son desires objective study to determine ability for intake of possible consistency. Discussed difference between swallow ability and reaching/sustaining adequate nutrition which appears tenuous at present. Son requests to be present for study and cannot stay today but able to return 11/13 1:30-4:00. Therapist explained MBS can be tentatively scheduled 11/13 at 1330 however unable to guarantee study can be performed. Continue diligent oral care. Uncertain of Palliative care's  involvement but recommended.       SLP Visit Diagnosis: Dysphagia, unspecified (R13.10)    Aspiration Risk  Severe aspiration risk;Moderate aspiration risk    Diet Recommendation NPO   Medication Administration: Via alternative means    Other  Recommendations Oral Care Recommendations: Oral care QID   Follow up Recommendations Skilled Nursing facility      Frequency and Duration min 2x/week  2 weeks       Prognosis Prognosis for Safe Diet Advancement:  (Fair-poor) Barriers to Reach Goals: Severity of deficits      Swallow Study   General Date of Onset: 04/02/20 HPI: 69 year old female who has a known history of advanced Parkinson's dementia found unresponsive. Pt intubated 11/6-11/8/21. Found to have acute hypoxic respiratory failure with probable aspiration pneumonia, acute DVT, thrombocytopenia, hypernatremia, AKI and severe malnutrition.  Type of Study: Bedside Swallow Evaluation Previous Swallow Assessment:  (none) Diet Prior to this Study: NPO;NG Tube Temperature Spikes Noted: No Respiratory Status: Nasal cannula History of Recent Intubation: Yes Length of Intubations (days): 3 days Date extubated: 04/04/20 Behavior/Cognition: Lethargic/Drowsy;Requires cueing Oral Cavity Assessment: Dry Oral Care Completed by SLP: Yes Oral Cavity - Dentition: Missing dentition Vision: Functional for self-feeding Self-Feeding Abilities: Total assist Patient Positioning: Upright in bed (head rotated to her left) Baseline Vocal Quality: Wet;Low vocal intensity Volitional Cough: Weak Volitional Swallow: Able to elicit (delayed)    Oral/Motor/Sensory Function Overall Oral Motor/Sensory Function: Generalized oral weakness   Ice Chips Ice chips: Not tested   Thin Liquid Thin Liquid: Impaired Presentation: Spoon Oral Phase Impairments: Reduced labial seal;Reduced lingual movement/coordination Oral Phase Functional Implications: Left anterior spillage;Right anterior  spillage Pharyngeal  Phase Impairments: Cough - Immediate;Decreased hyoid-laryngeal movement;Other (comments);Multiple swallows (audible/sluggish)    Nectar Thick Nectar Thick Liquid: Impaired Presentation: Spoon Oral Phase Impairments: Reduced lingual movement/coordination Pharyngeal Phase Impairments: Decreased hyoid-laryngeal movement;Multiple swallows;Throat Clearing - Delayed   Honey  Thick Honey Thick Liquid: Not tested   Puree Puree: Impaired Presentation: Spoon Oral Phase Impairments: Reduced labial seal;Reduced lingual movement/coordination Oral Phase Functional Implications:  (labial residue) Pharyngeal Phase Impairments: Multiple swallows;Decreased hyoid-laryngeal movement   Solid     Solid: Not tested      Houston Siren 04/08/2020,11:30 AM Orbie Pyo Colvin Caroli.Ed Risk analyst [phone] Office [phone]

## 2020-04-08 NOTE — Consult Note (Signed)
Consultation Note Date: 04/08/2020   Patient Name: Catherine Alexander  DOB: March 20, 1951  MRN: [id number]  Age / Sex: 69 y.o., female  PCP: Kerin Perna, NP Referring Physician: Florencia Reasons, MD  Reason for Consultation: Establishing goals of care  HPI/Patient Profile: 69 y.o. female   admitted on 04/02/2020    Clinical Assessment and Goals of Care: Patient is 69 years old.  She has life limiting illness of advanced Parkinson's disease.  She was admitted after having been found unresponsive at home.  On chart review it is noted that she probably had poor oral intake for several weeks prior to this admission.  She remains admitted to hospital medicine service for acute protein calorie malnutrition, possible aspiration pneumonia.  Briefly in this hospitalization she also had acute hypercapnic respiratory failure requiring ventilatory support.  She is since extubated.  She is on tube feeds via NG tube she is also on antibiotics.  He is noted to have failure to thrive and advanced Parkinson's disease with overall prognosis being poor, palliative consultation has been requested.  Patient is resting in bed.  She appears extremely cachectic.  Her eyes are open, she is awake but does not verbalize.  She is getting cleaned up.  She has an NG tube and has tube feeds ongoing.  I discussed with TRH MD.  Patient son is an academic and was probably at work, was unsuccessful in reaching him today.  Chart review notes that he is going to be at the bedside in the afternoon on 11-13.  See below.  Palliative medicine is specialized medical care for people living with serious illness. It focuses on providing relief from the symptoms and stress of a serious illness. The goal is to improve quality of life for both the patient and the family.  Goals of care: Broad aims of medical therapy in relation to the patient's values and preferences. Our  aim is to provide medical care aimed at enabling patients to achieve the goals that matter most to them, given the circumstances of their particular medical situation and their constraints.    NEXT OF KIN Lives at home with son.   SUMMARY OF RECOMMENDATIONS   Agree with DO NOT RESUSCITATE Continue current mode of care Agree with current trial of tube feeds, speech therapy following, possible modified barium swallow on 04-09-20. Patient's son, primary caregiver is going to be at the bedside in the afternoon on 04-09-20.  Will discuss further with him then about broad goals of care, disposition options and next best steps. Thank you for the consult.  Code Status/Advance Care Planning:  DNR    Symptom Management:   Continue current mode of care  Palliative Prophylaxis:   Delirium Protocol   Psycho-social/Spiritual:   Desire for further Chaplaincy support:yes  Additional Recommendations: Caregiving  Support/Resources  Prognosis:   Unable to determine  Discharge Planning: To Be Determined      Primary Diagnoses: Present on Admission: . Acute hypercapnic respiratory failure ([place])   I have reviewed the medical record,  interviewed the patient and family, and examined the patient. The following aspects are pertinent.  Past Medical History:  Diagnosis Date  . Dementia with behavioral disturbance ([place]) 01/24/2020   Social History   Socioeconomic History  . Marital status: Widowed    Spouse name: Not on file  . Number of children: Not on file  . Years of education: Not on file  . Highest education level: Not on file  Occupational History  . Not on file  Tobacco Use  . Smoking status: Never Smoker  . Smokeless tobacco: Never Used  Substance and Sexual Activity  . Alcohol use: Not on file  . Drug use: Not on file  . Sexual activity: Not on file  Other Topics Concern  . Not on file  Social History Narrative  . Not on file   Social Determinants of Health    Financial Resource Strain:   . Difficulty of Paying Living Expenses: Not on file  Food Insecurity:   . Worried About Charity fundraiser in the Last Year: Not on file  . Ran Out of Food in the Last Year: Not on file  Transportation Needs:   . Lack of Transportation (Medical): Not on file  . Lack of Transportation (Non-Medical): Not on file  Physical Activity:   . Days of Exercise per Week: Not on file  . Minutes of Exercise per Session: Not on file  Stress:   . Feeling of Stress : Not on file  Social Connections:   . Frequency of Communication with Friends and Family: Not on file  . Frequency of Social Gatherings with Friends and Family: Not on file  . Attends Religious Services: Not on file  . Active Member of Clubs or Organizations: Not on file  . Attends Archivist Meetings: Not on file  . Marital Status: Not on file   Family History  Problem Relation Age of Onset  . Heart disease Neg Hx    Scheduled Meds: . amoxicillin-clavulanate  1 tablet Oral TID  . chlorhexidine  15 mL Mouth Rinse BID  . docusate  100 mg Per Tube BID  . enoxaparin (LOVENOX) injection  40 mg Subcutaneous Q12H  . free water  300 mL Per Tube Q4H  . mouth rinse  15 mL Mouth Rinse q12n4p  . polyethylene glycol  17 g Oral Daily  . potassium chloride  40 mEq Oral Once   Continuous Infusions: . dextrose 5 % and 0.2 % NaCl with KCl 20 mEq 50 mL/hr at 04/05/20 0821  . famotidine (PEPCID) IV 20 mg (04/07/20 2109)  . feeding supplement (OSMOLITE 1.2 CAL) 1,000 mL (04/05/20 1709)   PRN Meds:.acetaminophen, docusate sodium, fentaNYL (SUBLIMAZE) injection, fentaNYL (SUBLIMAZE) injection, midazolam, midazolam, ondansetron (ZOFRAN) IV, polyethylene glycol Medications Prior to Admission:  Prior to Admission medications   Not on File   No Known Allergies Review of Systems Awake, tracks me in the room, doesn't verbalize.   Physical Exam Very frail appearing lady resting in bed, has NG tube and tube  feedings are ongoing Diminished breath sounds towards the bases Patient has muscle wasting and generalized weakness noted S1-S2 Abdomen is not distended Patient is however awake, tracks me in the room.  Vital Signs: BP 119/69 (BP Location: Right Arm)   Pulse 80   Temp 97.6 F (36.4 C) (Oral)   Resp 17   Ht 5\' 4"  (1.626 m)   Wt 39.3 kg   SpO2 100%   BMI 14.87 kg/m  Pain Scale:  0-10   Pain Score: 0-No pain   SpO2: SpO2: 100 % O2 Device:SpO2: 100 % O2 Flow Rate: .   IO: Intake/output summary:   Intake/Output Summary (Last 24 hours) at 04/08/2020 1445 Last data filed at 04/08/2020 1145 Gross per 24 hour  Intake --  Output 601 ml  Net -601 ml    LBM: Last BM Date:  (04/07/20) Baseline Weight: Weight: 33.7 kg Most recent weight: Weight: 39.3 kg     Palliative Assessment/Data:   PPS 40%  Time In:  1330 Time Out:  1430 Time Total:   60  Greater than 50%  of this time was spent counseling and coordinating care related to the above assessment and plan.  Signed by: Loistine Chance, MD   Please contact Palliative Medicine Team phone at [phone] for questions and concerns.  For individual provider: See Shea Evans

## 2020-04-09 ENCOUNTER — Inpatient Hospital Stay (HOSPITAL_COMMUNITY): Payer: Self-pay

## 2020-04-09 LAB — COMPREHENSIVE METABOLIC PANEL
ALT: 56 U/L — ABNORMAL HIGH (ref 0–44)
AST: 43 U/L — ABNORMAL HIGH (ref 15–41)
Albumin: 1.7 g/dL — ABNORMAL LOW (ref 3.5–5.0)
Alkaline Phosphatase: 52 U/L (ref 38–126)
Anion gap: 5 (ref 5–15)
BUN: 13 mg/dL (ref 8–23)
CO2: 24 mmol/L (ref 22–32)
Calcium: 7.6 mg/dL — ABNORMAL LOW (ref 8.9–10.3)
Chloride: 105 mmol/L (ref 98–111)
Creatinine, Ser: 0.45 mg/dL (ref 0.44–1.00)
GFR, Estimated: >60 mL/min (ref >60)
Glucose, Bld: 127 mg/dL — ABNORMAL HIGH (ref 70–99)
Potassium: 3.9 mmol/L (ref 3.5–5.1)
Sodium: 134 mmol/L — ABNORMAL LOW (ref 135–145)
Total Bilirubin: 0.6 mg/dL (ref 0.3–1.2)
Total Protein: 5.3 g/dL — ABNORMAL LOW (ref 6.5–8.1)

## 2020-04-09 LAB — CBC
HCT: 28.9 % — ABNORMAL LOW (ref 36.0–46.0)
Hemoglobin: 9.5 g/dL — ABNORMAL LOW (ref 12.0–15.0)
MCH: 31.5 pg (ref 26.0–34.0)
MCHC: 32.9 g/dL (ref 30.0–36.0)
MCV: 95.7 fL (ref 80.0–100.0)
Platelets: 86 10*3/uL — ABNORMAL LOW (ref 150–400)
RBC: 3.02 MIL/uL — ABNORMAL LOW (ref 3.87–5.11)
RDW: 14.3 % (ref 11.5–15.5)
WBC: 9.3 10*3/uL (ref 4.0–10.5)
nRBC: 0 % (ref 0.0–0.2)

## 2020-04-09 LAB — GLUCOSE, CAPILLARY
Glucose-Capillary: 117 mg/dL — ABNORMAL HIGH (ref 70–99)
Glucose-Capillary: 121 mg/dL — ABNORMAL HIGH (ref 70–99)
Glucose-Capillary: 129 mg/dL — ABNORMAL HIGH (ref 70–99)
Glucose-Capillary: 130 mg/dL — ABNORMAL HIGH (ref 70–99)
Glucose-Capillary: 91 mg/dL (ref 70–99)

## 2020-04-09 LAB — LACTIC ACID, PLASMA: Lactic Acid, Venous: 1.5 mmol/L (ref 0.5–1.9)

## 2020-04-09 LAB — MAGNESIUM: Magnesium: 1.8 mg/dL (ref 1.7–2.4)

## 2020-04-09 MED ORDER — SENNOSIDES-DOCUSATE SODIUM 8.6-50 MG PO TABS
1.0000 | ORAL_TABLET | Freq: Every day | ORAL | Status: DC
  Administered 2020-04-09 – 2020-04-11 (×2): 1
  Filled 2020-04-09 (×3): qty 1

## 2020-04-09 MED ORDER — FREE WATER
150.0000 mL | Status: DC
  Administered 2020-04-09 – 2020-04-10 (×5): 150 mL

## 2020-04-09 MED ORDER — AMOXICILLIN-POT CLAVULANATE 250-62.5 MG/5ML PO SUSR
500.0000 mg | Freq: Three times a day (TID) | ORAL | Status: DC
  Administered 2020-04-09 – 2020-04-12 (×10): 500 mg
  Filled 2020-04-09 (×12): qty 10

## 2020-04-09 MED ORDER — FREE WATER: 100.0000 mL | Status: DC

## 2020-04-09 MED ORDER — FREE WATER: 200.0000 mL | Status: DC

## 2020-04-09 NOTE — Progress Notes (Addendum)
Modified Barium Swallow Progress Note  Patient Details  Name: Catherine Alexander MRN: [id number] Date of Birth: 11-20-1950  Today's Date: 04/09/2020  Modified Barium Swallow completed.  Full report located under Chart Review in the Imaging Section.  Brief recommendations include the following:  Clinical Impression  Son present for MBS and translated for SLP.  Pt presents with moderately severe oral and moderate pharyngeal dysphagia with sensormotor deficits. Decreased labial seal and head turned and tilted to left *pt appears pained when trying to place head in midline position* results in spillage of barium from left labial region with decreased awareness.  Lingual weakness and discoordination results in decreased bolus cohesion, lingual pumping, impaired transit of solids/puree more than liquids and oral residuals.  SLP frequently suctioned during testing to clear barium from oral cavity that pt did not swallow.    Pharyngeal swallow was marked by weakness resulting in retention, delay in swallow to pyriform with solid and laryngeal penetration of thin during swallow.  Trace aspiration noted after swallow of retained secretions in pyriform sinus- aspiration was not sensed and pt did not cough on command.  Pharyngeal retention increased as testing progressed, concerning for potential fatigue.    Pt aspirated thin as barium retention in pyriform spilled into airway = posterior trachea without awareness.  With pt's decreased cough mechanism, if she does aspirate, concern for clearance is present.  Do not anticipate she will meet nutritional needs however would recommend pt have full liquid diet *nectar thick* on trial basis to assess for tolerance, readiness for advancement, etc.  Also advise, thin water between meals after mouth care for oral hygiene and comfort.    SLP questions cognitive component to pt's dysphagia given her tendency to bite down on suction, not follow directions, allow labial  spillage, and have neck extention?  Son reports pt swallowed fine prior to admission. Will follow closely.   Swallow Evaluation Recommendations       SLP Diet Recommendations: Nectar thick liquid;Other (Comment);Free water protocol after oral care, Full liquid   Liquid Administration via: Cup;Straw   Medication Administration: Crushed with puree   Supervision: Full assist for feeding;Full supervision/cueing for compensatory strategies   Compensations: Slow rate;Small sips/bites;Other (Comment) (clean mouth before and after meals, oral suction during meals)   Postural Changes: Remain semi-upright after after feeds/meals (Comment);Seated upright at 90 degrees   Oral Care Recommendations: Oral care QID;Oral care before and after PO   Other Recommendations: Order thickener from pharmacy;Have oral suction available   [name], MS [facility] SLP Acute Rehab Services Office [phone] Pager [phone]   [name] 04/09/2020,6:42 PM   MBS was on schedule to be completed at 1330 but needed to be deferred until son arrived. Test started at approximately 1430.

## 2020-04-09 NOTE — Progress Notes (Signed)
PROGRESS NOTE    Catherine Alexander  DXI:[id number] DOB: 09-Oct-1950 DOA: 04/02/2020 PCP: Kerin Perna, NP    Chief Complaint  Patient presents with   Failure To Thrive    Brief Narrative:  69 y/o female with history of advanced Parkinson dementia admitted from the [facility] emergency department after she was found unresponsive at home on 04/02/2020.  She has a history of advanced Parkinson's disease and has had poor p.o. intake for several weeks prior to admission In the [facility] long emergency department she was noted to be hypotensive, unresponsive, with agonal respirations so she was intubated. She is now extubated, transferred out of ICU  Subjective:  She  Returned from swallow study, Appears more awake, start to track with her eyes and attempt to follow commands   Assessment & Plan:   Active Problems:   Acute hypercapnic respiratory failure (HCC)   Protein-calorie malnutrition, severe   Acute hypoxic respiratory failure with possible aspiration pneumonia -Intubated in the ED and admitted to ICU ,she is now extubated -Was treated with Unasyn switched to Augmentin per tube -Replaced temporary feeding tube - nutrition and meds per NG tube, speech eval  Acute DVT -Start anticoagulation with Lovenox, consider transition to apixaban   Acute thrombocytopenia No sign of bleeding Likely consumption from acute DVT Monitor platelet while on anticoagulation, slightly improved today  Hypernatremia Sodium 171 on presentation, normalized with frequent free water flush through NG tube Change free water flush 300cc from every 2 hours to every 4 hours, Sodium 134 today, decrease free water flushes to 150cc q4hs   AKI BUN 106 creatinine 2.76 on presentation -BUN/creatinine normalized this morning  Severe malnutrition, she is bound and [place] on exam -Continue tube feeds -Goals of care discussion  Failure to thrive, advanced Parkinson's disease -Overall prognosis is very  poor, had long discussion with son at bedside for goals of care, son has caregiver exhaustion, his wife has psychiatric disorder not able to help -Patient will benefit from hospice service, son confirmed DNR status and agreed to talk to palliative care  Patient is not [place] citizen, has no insurance, she initially came to [place] after her husband passed away, she was not able to return to [place] since [place] pandemic, now she is severely debilitated. Case manager help with urgent Medicaid application   DVT prophylaxis: enoxaparin (LOVENOX) injection 40 mg Start: 04/07/20 1800 SCDs Start: 04/02/20 1807   Code Status: DNR Family Communication: Son at bedside Disposition:   Status is: Inpatient    Dispo: The patient is from: Home              Anticipated d/c is to: SNF with palliative care vs hospice              Anticipated d/c date is: To be determined                Consultants:   Admitted to ICU, transferred to hospitalist service on November 10  Palliative care  Procedures:   Intubation and extubation  NG tube placement  Antimicrobials:   As above     Objective: Vitals:   04/08/20 2150 04/09/20 0407 04/09/20 0452 04/09/20 1255  BP: 134/73 (!) 128/54  126/62  Pulse: 91 87  82  Resp: 18 14  16   Temp: 98.3 F (36.8 C) 98.3 F (36.8 C)  98 F (36.7 C)  TempSrc: Oral Oral  Oral  SpO2: 100% 95%  99%  Weight:   46.4 kg  Height:        Intake/Output Summary (Last 24 hours) at 04/09/2020 1527 Last data filed at 04/09/2020 0451 Gross per 24 hour  Intake --  Output 1150 ml  Net -1150 ml   Filed Weights   04/04/20 0426 04/05/20 0500 04/09/20 0452  Weight: 33.7 kg 39.3 kg 46.4 kg    Examination:  General exam: Very frail, lethargic, nonverbal, open eyes , attempt to follow commands at times ,cachectic, feeding tube in place Respiratory system: Diminished at bases, no wheezing poor respiratory effort. Cardiovascular system: S1 & S2 heard,  RRR.  Trace ankle edema. Gastrointestinal system: Abdomen is nondistended, soft and nontender.  Normal bowel sounds heard. Central nervous system: Very lethargic, does not follow commands Extremities: Generalized weakness, moving upper extremities, not able to move lower extremity, start to have foot drop Skin: No rashes, lesions or ulcers Psychiatry: Awake, alert today    Data Reviewed: I have personally reviewed following labs and imaging studies  CBC: Recent Labs  Lab 04/03/20 0300 04/07/20 0536 04/08/20 0518 04/09/20 0735  WBC 13.4* 13.3* 9.2 9.3  NEUTROABS  --  12.2*  --   --   HGB 14.2 11.6* 11.1* 9.5*  HCT 47.2* 35.6* 34.5* 28.9*  MCV 103.7* 98.1 95.8 95.7  PLT 121* 66* 94* 86*    Basic Metabolic Panel: Recent Labs  Lab 04/04/20 0247 04/04/20 0247 04/04/20 1805 04/04/20 1805 04/05/20 0243 04/05/20 0243 04/05/20 1241 04/05/20 1707 04/07/20 0536 04/08/20 0518 04/09/20 0735  NA 146*  --   --   --   --   --  139  --  137 138 134*  K 4.2  --   --   --   --   --  4.1  --  4.2 4.2 3.9  CL 115*  --   --   --   --   --  112*  --  109 108 105  CO2 16*  --   --   --   --   --  19*  --  21* 24 24  GLUCOSE 156*  --   --   --   --   --  142*  --  95 117* 127*  BUN 106*  --   --   --   --   --  42*  --  16 15 13   CREATININE 2.01*  --   --   --   --   --  1.15*  --  0.59 0.52 0.45  CALCIUM 7.8*  --   --   --   --   --  8.1*  --  8.0* 8.2* 7.6*  MG 2.3   < > 2.2   < > 2.1   < > 2.2 2.2 1.9 1.9 1.8  PHOS 2.4*  --  2.2*  --  2.7  --  2.9 2.6  --   --   --    < > = values in this interval not displayed.    GFR: Estimated Creatinine Clearance: 48.6 mL/min (by C-G formula based on SCr of 0.45 mg/dL).  Liver Function Tests: Recent Labs  Lab 04/09/20 0735  AST 43*  ALT 56*  ALKPHOS 52  BILITOT 0.6  PROT 5.3*  ALBUMIN 1.7*    CBG: Recent Labs  Lab 04/08/20 1610 04/08/20 2154 04/09/20 0011 04/09/20 0446 04/09/20 1137  GLUCAP 137* 138* 129* 130* 121*      Recent Results (from the past 240 hour(s))  Culture, blood (routine  x 2)     Status: None   Collection Time: 04/02/20  4:24 PM   Specimen: BLOOD  Result Value Ref Range Status   Specimen Description   Final    BLOOD RIGHT ANTECUBITAL Performed at [facility], [place] [address], [place], [place] [address]    Special Requests   Final    BOTTLES DRAWN AEROBIC ONLY Blood Culture results may not be optimal due to an inadequate volume of blood received in culture bottles Performed at [facility] [address] [place], [place] [address]    Culture   Final    NO GROWTH 5 DAYS Performed at [facility], [place] [address] [place], [place] [address]    Report Status 04/07/2020 FINAL  Final  Culture, blood (routine x 2)     Status: None   Collection Time: 04/02/20  4:29 PM   Specimen: BLOOD  Result Value Ref Range Status   Specimen Description   Final    BLOOD RIGHT ANTECUBITAL Performed at [facility] [address], [place], [place] [address]    Special Requests   Final    BOTTLES DRAWN AEROBIC AND ANAEROBIC Blood Culture adequate volume Performed at [facility] [address], [place], [place] [address]    Culture   Final    NO GROWTH 5 DAYS Performed at [facility], [place] [address] [place], [place] [address]    Report Status 04/07/2020 FINAL  Final  Respiratory Panel by RT PCR (Flu A&B, Covid) - Nasopharyngeal Swab     Status: None   Collection Time: 04/02/20  5:32 PM   Specimen: Nasopharyngeal Swab  Result Value Ref Range Status   SARS Coronavirus 2 by RT PCR NEGATIVE NEGATIVE Final    Comment: (NOTE) SARS-CoV-2 target nucleic acids are NOT DETECTED.  The SARS-CoV-2 RNA is generally detectable in upper respiratoy specimens during the acute phase of infection. The lowest concentration of SARS-CoV-2 viral copies this assay can detect is 131 copies/mL. A negative result does not preclude  SARS-Cov-2 infection and should not be used as the sole basis for treatment or other patient management decisions. A negative result may occur with  improper specimen collection/handling, submission of specimen other than nasopharyngeal swab, presence of viral mutation(s) within the areas targeted by this assay, and inadequate number of viral copies (<131 copies/mL). A negative result must be combined with clinical observations, patient history, and epidemiological information. The expected result is Negative.  Fact Sheet for Patients:  PinkCheek.be  Fact Sheet for Healthcare Providers:  GravelBags.it  This test is no t yet approved or cleared by the [place] FDA and  has been authorized for detection and/or diagnosis of SARS-CoV-2 by FDA under an Emergency Use Authorization (EUA). This EUA will remain  in effect (meaning this test can be used) for the duration of the COVID-19 declaration under Section 564(b)(1) of the Act, 21 U.S.C. section 360bbb-3(b)(1), unless the authorization is terminated or revoked sooner.     Influenza A by PCR NEGATIVE NEGATIVE Final   Influenza B by PCR NEGATIVE NEGATIVE Final    Comment: (NOTE) The Xpert Xpress SARS-CoV-2/FLU/RSV assay is intended as an aid in  the diagnosis of influenza from Nasopharyngeal swab specimens and  should not be used as a sole basis for treatment. Nasal washings and  aspirates are unacceptable for Xpert Xpress SARS-CoV-2/FLU/RSV  testing.  Fact Sheet for Patients: PinkCheek.be  Fact Sheet for Healthcare Providers: GravelBags.it  This test is not yet approved or cleared by the [place] and  has been authorized for detection and/or diagnosis of SARS-CoV-2 by  FDA under an Emergency Use Authorization (EUA). This EUA will remain  in effect (meaning this test can be used) for the duration of the   Covid-19 declaration under Section 564(b)(1) of the Act, 21  U.S.C. section 360bbb-3(b)(1), unless the authorization is  terminated or revoked. Performed at [facility], [place] [address] [place], [place] [address]   MRSA PCR Screening     Status: None   Collection Time: 04/02/20  8:05 PM   Specimen: Nasal Mucosa; Nasopharyngeal  Result Value Ref Range Status   MRSA by PCR NEGATIVE NEGATIVE Final    Comment:        The GeneXpert MRSA Assay (FDA approved for NASAL specimens only), is one component of a comprehensive MRSA colonization surveillance program. It is not intended to diagnose MRSA infection nor to guide or monitor treatment for MRSA infections. Performed at [facility], [place] [address] [place], [place] [address]          Radiology Studies: No results found.      Scheduled Meds:  amoxicillin-clavulanate  500 mg Per Tube TID   chlorhexidine  15 mL Mouth Rinse BID   enoxaparin (LOVENOX) injection  40 mg Subcutaneous Q12H   free water  150 mL Per Tube Q4H   mouth rinse  15 mL Mouth Rinse q12n4p   senna-docusate  1 tablet Per Tube QHS   Continuous Infusions:  famotidine (PEPCID) IV 20 mg (04/09/20 0001)   feeding supplement (OSMOLITE 1.2 CAL) 1,000 mL (04/05/20 1709)     LOS: 7 days   Time spent: 36mins, continued goals of care discussion with son at bedside today, discussed with palliative care Greater than 50% of this time was spent in counseling, explanation of diagnosis, planning of further management, and coordination of care.  I have personally reviewed and interpreted on  04/09/2020 daily labs, I reviewed all nursing notes, pharmacy notes, ICU notes,  vitals, pertinent old records  I have discussed plan of care as described above with RN , patient and family on 04/09/2020  Voice Recognition /Dragon dictation system was used to create this note, attempts have been made to correct errors. Please  contact the author with questions and/or clarifications.   Florencia Reasons, MD PhD FACP [facility]  Available via Epic secure chat 7am-7pm for nonurgent issues Please page for urgent issues To page the attending provider between 7A-7P or the covering provider during after hours 7P-7A, please log into the web site [email or web address] and access using universal [place] password for that web site. If you do not have the password, please call the hospital operator.    04/09/2020, 3:27 PM

## 2020-04-09 NOTE — Progress Notes (Signed)
SLP called pt's son at [phone] to review results of swallowing test and recommendations. Had messaged RN at [phone] to relay to pt's son that SLP was running late but would see pt before leaving today or call son at earliest convenience.  Left message on his voice mail at [place] *his outgoing message identifies himself*.   Requested son to page SLP at [phone] and SLP would return call regarding pt's care plan, recommendations.  Will follow up on Monday 04/11/2020.    [name], MS [facility] SLP Acute Rehab Services Office [phone] Pager [phone]

## 2020-04-09 NOTE — Progress Notes (Signed)
Daily Progress Note   Patient Name: Catherine Alexander       Date: 04/09/2020 DOB: 1951/05/25  Age: 69 y.o. MRN#: [id number] Attending Physician: Florencia Reasons, MD Primary Care Physician: Kerin Perna, NP Admit Date: 04/02/2020  Reason for Consultation/Follow-up: Establishing goals of care  Subjective: Patient appears more alert, is interactive with her son who is present at the bedside.  Patient going soon for modified barium swallow study.  Length of Stay: 7  Current Medications: Scheduled Meds:  . amoxicillin-clavulanate  500 mg Per Tube TID  . chlorhexidine  15 mL Mouth Rinse BID  . docusate  100 mg Per Tube BID  . enoxaparin (LOVENOX) injection  40 mg Subcutaneous Q12H  . free water  300 mL Per Tube Q4H  . mouth rinse  15 mL Mouth Rinse q12n4p  . polyethylene glycol  17 g Oral Daily    Continuous Infusions: . dextrose 5 % and 0.2 % NaCl with KCl 20 mEq 50 mL/hr at 04/09/20 1157  . famotidine (PEPCID) IV 20 mg (04/09/20 0001)  . feeding supplement (OSMOLITE 1.2 CAL) 1,000 mL (04/05/20 1709)    PRN Meds: acetaminophen, docusate sodium, fentaNYL (SUBLIMAZE) injection, fentaNYL (SUBLIMAZE) injection, midazolam, midazolam, ondansetron (ZOFRAN) IV, polyethylene glycol  Physical Exam         Awake alert Appears frail Has muscle wasting upper and lower extremities Regular work of breathing Has NG tube  Vital Signs: BP 126/62 (BP Location: Right Arm)   Pulse 82   Temp 98 F (36.7 C) (Oral)   Resp 16   Ht 5\' 4"  (1.626 m)   Wt 46.4 kg   SpO2 99%   BMI 17.56 kg/m  SpO2: SpO2: 99 % O2 Device: O2 Device: Room Air O2 Flow Rate:    Intake/output summary:   Intake/Output Summary (Last 24 hours) at 04/09/2020 1443 Last data filed at 04/09/2020 0451 Gross per 24 hour  Intake  --  Output 1150 ml  Net -1150 ml   LBM: Last BM Date: 04/08/20 Baseline Weight: Weight: 33.7 kg Most recent weight: Weight: 46.4 kg       Palliative Assessment/Data:      Patient Active Problem List   Diagnosis Date Noted  . Protein-calorie malnutrition, severe 04/04/2020  . Acute hypercapnic respiratory failure ([place]) 04/02/2020  . Dehydration 01/24/2020  .  Elevated CK 01/24/2020  . Dementia with behavioral disturbance ([place]) 01/24/2020  . SDH (subdural hematoma) ([place]) 01/24/2020  . Parkinson disease ([place]) 01/24/2020  . Abnormal foot color 01/24/2020    Palliative Care Assessment & Plan   Patient Profile:    Assessment: Advanced Parkinson's dementia Admitted with acute hypoxic respiratory failure deemed secondary to possible aspiration pneumonia Found to have acute DVT started on anticoagulation Severe malnutrition, failure to thrive Recommendations/Plan:  Modified barium swallow to be done today.  Follow results.  Discussed with son briefly.  If at all possible, he would desire safest possible oral intake as opposed to artificial nutrition. Agree with DO NOT RESUSCITATE Monitor hospital course Emergency Medicaid application is pending.  Recommend skilled nursing facility rehabilitation attempt with palliative services towards the end of this hospitalization.  The patient has acute precipitous decline or is not showing any signs of meaningful recovery, then would recommend more of a comfort based approach and would recommend residential hospice at that time.     Code Status:    Code Status Orders  (From admission, onward)         Start     Ordered   04/02/20 1807  Do not attempt resuscitation (DNR)  Continuous       Question Answer Comment  In the event of cardiac or respiratory ARREST Do not call a "code blue"   In the event of cardiac or respiratory ARREST Do not perform Intubation, CPR, defibrillation or ACLS   In the event of cardiac or respiratory ARREST  Use medication by any route, position, wound care, and other measures to relive pain and suffering. May use oxygen, suction and manual treatment of airway obstruction as needed for comfort.      04/02/20 1806        Code Status History    Date Active Date Inactive Code Status Order ID Comments User Context   01/24/2020 0601 01/25/2020 2310 Full Code [id number]  Shalhoub, Sherryll Burger, MD ED   Advance Care Planning Activity       Prognosis:   Unable to determine  Discharge Planning:  To Be Determined  Care plan was discussed with patient's son and TRH MD.    Thank you for allowing the Palliative Medicine Team to assist in the care of this patient.   Time In: 1400 Time Out: 1425 Total Time 25 Prolonged Time Billed  no       Greater than 50%  of this time was spent counseling and coordinating care related to the above assessment and plan.  Loistine Chance, MD  Please contact Palliative Medicine Team phone at [phone] for questions and concerns.

## 2020-04-10 DIAGNOSIS — R1310 Dysphagia, unspecified: Secondary | ICD-10-CM

## 2020-04-10 LAB — COMPREHENSIVE METABOLIC PANEL
ALT: 51 U/L — ABNORMAL HIGH (ref 0–44)
AST: 48 U/L — ABNORMAL HIGH (ref 15–41)
Albumin: 1.6 g/dL — ABNORMAL LOW (ref 3.5–5.0)
Alkaline Phosphatase: 52 U/L (ref 38–126)
Anion gap: 7 (ref 5–15)
BUN: 13 mg/dL (ref 8–23)
CO2: 24 mmol/L (ref 22–32)
Calcium: 7.6 mg/dL — ABNORMAL LOW (ref 8.9–10.3)
Chloride: 102 mmol/L (ref 98–111)
Creatinine, Ser: 0.44 mg/dL (ref 0.44–1.00)
GFR, Estimated: >60 mL/min (ref >60)
Glucose, Bld: 115 mg/dL — ABNORMAL HIGH (ref 70–99)
Potassium: 4.1 mmol/L (ref 3.5–5.1)
Sodium: 133 mmol/L — ABNORMAL LOW (ref 135–145)
Total Bilirubin: 0.7 mg/dL (ref 0.3–1.2)
Total Protein: 5.3 g/dL — ABNORMAL LOW (ref 6.5–8.1)

## 2020-04-10 LAB — CBC
HCT: 29.9 % — ABNORMAL LOW (ref 36.0–46.0)
Hemoglobin: 9.9 g/dL — ABNORMAL LOW (ref 12.0–15.0)
MCH: 31.4 pg (ref 26.0–34.0)
MCHC: 33.1 g/dL (ref 30.0–36.0)
MCV: 94.9 fL (ref 80.0–100.0)
Platelets: 107 10*3/uL — ABNORMAL LOW (ref 150–400)
RBC: 3.15 MIL/uL — ABNORMAL LOW (ref 3.87–5.11)
RDW: 14.3 % (ref 11.5–15.5)
WBC: 14 10*3/uL — ABNORMAL HIGH (ref 4.0–10.5)
nRBC: 0 % (ref 0.0–0.2)

## 2020-04-10 LAB — GLUCOSE, CAPILLARY
Glucose-Capillary: 114 mg/dL — ABNORMAL HIGH (ref 70–99)
Glucose-Capillary: 114 mg/dL — ABNORMAL HIGH (ref 70–99)
Glucose-Capillary: 127 mg/dL — ABNORMAL HIGH (ref 70–99)
Glucose-Capillary: 138 mg/dL — ABNORMAL HIGH (ref 70–99)

## 2020-04-10 LAB — MAGNESIUM: Magnesium: 1.7 mg/dL (ref 1.7–2.4)

## 2020-04-10 MED ORDER — FUROSEMIDE 10 MG/ML IJ SOLN
20.0000 mg | Freq: Once | INTRAMUSCULAR | Status: AC
  Administered 2020-04-10: 20 mg via INTRAVENOUS
  Filled 2020-04-10: qty 2

## 2020-04-10 MED ORDER — FREE WATER
100.0000 mL | Freq: Three times a day (TID) | Status: DC
  Administered 2020-04-10 – 2020-04-12 (×6): 100 mL

## 2020-04-10 NOTE — Progress Notes (Signed)
PROGRESS NOTE    Catherine Alexander  GUY:[id number] DOB: 04/19/1951 DOA: 04/02/2020 PCP: Kerin Perna, NP    Chief Complaint  Patient presents with  . Failure To Thrive    Brief Narrative:  69 y/o female with history of advanced Parkinson dementia admitted from the [facility] emergency department after she was found unresponsive at home on 04/02/2020.  She has a history of advanced Parkinson's disease and has had poor p.o. intake for several weeks prior to admission In the [facility] long emergency department she was noted to be hypotensive, unresponsive, with agonal respirations so she was intubated. She is now extubated, transferred out of ICU  Subjective:  No meaningful clinical improvement, tolerating tube feeds, open eyes , does attempt to follow commands at times, but very weak   Assessment & Plan:   Active Problems:   Acute hypercapnic respiratory failure (HCC)   Protein-calorie malnutrition, severe   Acute hypoxic respiratory failure with possible aspiration pneumonia -Intubated in the ED and admitted to ICU ,she is now extubated, on room air -Was treated with Unasyn switched to Augmentin per tube -Replaced temporary feeding tube - nutrition and meds per NG tube, she did not do well speech eval, she is very lethargic today, will continue npo -Continue goals of care discussion, son expressed wishes the rest of the family would like to see patient  live pass her 81'th birthday which will be on [birth year].   Leukocytosis -She presented with leukocytosis, WBC normalized November 12 and 13, however Sudden increase of wbc today at 46 -she does has risk of recurrent aspiration, currently she is on room air, she is too weak to cough, she is continued on Augmentin per tube -No fever, continue monitor  Acute DVT -Start anticoagulation with Lovenox, consider transition to apixaban if does not transition to full comfort measures  Acute thrombocytopenia No sign of bleeding Likely  consumption from acute DVT plt nadir at 66, improved after started on anticoagulation   Hypernatremia Sodium 171 on presentation, normalized with frequent free water flush through NG tube Change free water flush 300cc from every 2 hours to every 4 hours, Sodium 133 today, decrease free water flushes to 100cc q8hs, repeat bmp in am   AKI BUN 106 creatinine 2.76 on presentation -BUN/creatinine normalized   Severe malnutrition, she is skin and bones on exam -Continue tube feeds -Goals of care discussion  Failure to thrive, advanced Parkinson's disease -Overall prognosis is very poor, having long discussion with son at bedside for goals of care daily, son has caregiver exhaustion, his wife has psychiatric disorder not able to help.  -Patient will benefit from hospice service, son confirmed DNR status and agreed to talk to palliative care  Patient is not [place] citizen, has no insurance, she initially came to [place] after her husband passed away, she was not able to return to [place] since [place] pandemic, now she is severely debilitated. Case manager help with urgent Medicaid application   DVT prophylaxis: enoxaparin (LOVENOX) injection 40 mg Start: 04/07/20 1800 SCDs Start: 04/02/20 1807   Code Status: DNR Family Communication: Son at bedside daily Disposition:   Status is: Inpatient    Dispo: The patient is from: Home              Anticipated d/c is to: SNF with palliative care vs hospice              Anticipated d/c date is: To be determined, awaiting medicaid application  Consultants:   Admitted to ICU, transferred to hospitalist service on November 10  Palliative care  Procedures:   Intubation and extubation  NG tube placement  Antimicrobials:   As above     Objective: Vitals:   04/09/20 1255 04/09/20 2243 04/10/20 0515 04/10/20 1419  BP: 126/62 106/82 127/61 117/71  Pulse: 82 (!) 106 97 89  Resp: 16 18 14 20   Temp: 98 F  (36.7 C) 98 F (36.7 C) (!) 97.5 F (36.4 C) 98 F (36.7 C)  TempSrc: Oral Oral Oral Oral  SpO2: 99% 99% 96% 97%  Weight:   43.1 kg   Height:        Intake/Output Summary (Last 24 hours) at 04/10/2020 1657 Last data filed at 04/10/2020 0535 Gross per 24 hour  Intake --  Output 650 ml  Net -650 ml   Filed Weights   04/05/20 0500 04/09/20 0452 04/10/20 0515  Weight: 39.3 kg 46.4 kg 43.1 kg    Examination:  General exam: Very frail, lethargic, nonverbal, open eyes , attempt to follow commands at times ,cachectic, feeding tube in place Respiratory system: Diminished at bases, no wheezing poor respiratory effort. Cardiovascular system: S1 & S2 heard, RRR.  Trace ankle edema. Gastrointestinal system: Abdomen is nondistended, soft and nontender.  Normal bowel sounds heard. Central nervous system: Very lethargic, does not follow commands Extremities: Generalized weakness,  upper extremities appear start to have contractures at elbow, does not appear able to extend elbow, not able to move lower extremity, start to have foot drop Skin: No rashes, lesions or ulcers Psychiatry: lethargic, open eyes    Data Reviewed: I have personally reviewed following labs and imaging studies  CBC: Recent Labs  Lab 04/07/20 0536 04/08/20 0518 04/09/20 0735 04/10/20 0638  WBC 13.3* 9.2 9.3 14.0*  NEUTROABS 12.2*  --   --   --   HGB 11.6* 11.1* 9.5* 9.9*  HCT 35.6* 34.5* 28.9* 29.9*  MCV 98.1 95.8 95.7 94.9  PLT 66* 94* 86* 107*    Basic Metabolic Panel: Recent Labs  Lab 04/04/20 0247 04/04/20 0247 04/04/20 1805 04/04/20 1805 04/05/20 0243 04/05/20 1241 04/05/20 1241 04/05/20 1707 04/07/20 0536 04/08/20 0518 04/09/20 0735 04/10/20 0638  NA 146*   < >  --   --   --  139  --   --  137 138 134* 133*  K 4.2   < >  --   --   --  4.1  --   --  4.2 4.2 3.9 4.1  CL 115*   < >  --   --   --  112*  --   --  109 108 105 102  CO2 16*   < >  --   --   --  19*  --   --  21* 24 24 24    GLUCOSE 156*   < >  --   --   --  142*  --   --  95 117* 127* 115*  BUN 106*   < >  --   --   --  42*  --   --  16 15 13 13   CREATININE 2.01*   < >  --   --   --  1.15*  --   --  0.59 0.52 0.45 0.44  CALCIUM 7.8*   < >  --   --   --  8.1*  --   --  8.0* 8.2* 7.6* 7.6*  MG 2.3   < >  2.2   < > 2.1 2.2   < > 2.2 1.9 1.9 1.8 1.7  PHOS 2.4*  --  2.2*  --  2.7 2.9  --  2.6  --   --   --   --    < > = values in this interval not displayed.    GFR: Estimated Creatinine Clearance: 45.2 mL/min (by C-G formula based on SCr of 0.44 mg/dL).  Liver Function Tests: Recent Labs  Lab 04/09/20 0735 04/10/20 0638  AST 43* 48*  ALT 56* 51*  ALKPHOS 52 52  BILITOT 0.6 0.7  PROT 5.3* 5.3*  ALBUMIN 1.7* 1.6*    CBG: Recent Labs  Lab 04/09/20 0748 04/09/20 1137 04/09/20 1659 04/10/20 0754 04/10/20 1303  GLUCAP 117* 121* 91 114* 114*     Recent Results (from the past 240 hour(s))  Culture, blood (routine x 2)     Status: None   Collection Time: 04/02/20  4:24 PM   Specimen: BLOOD  Result Value Ref Range Status   Specimen Description   Final    BLOOD RIGHT ANTECUBITAL Performed at [facility], [place] [address], [place], [place] [address]    Special Requests   Final    BOTTLES DRAWN AEROBIC ONLY Blood Culture results may not be optimal due to an inadequate volume of blood received in culture bottles Performed at [facility] [address] [place], [place] [address]    Culture   Final    NO GROWTH 5 DAYS Performed at [facility], [place] [address] [place], [place] [address]    Report Status 04/07/2020 FINAL  Final  Culture, blood (routine x 2)     Status: None   Collection Time: 04/02/20  4:29 PM   Specimen: BLOOD  Result Value Ref Range Status   Specimen Description   Final    BLOOD RIGHT ANTECUBITAL Performed at [facility] [address], [place], [place] [address]    Special Requests   Final    BOTTLES DRAWN AEROBIC AND  ANAEROBIC Blood Culture adequate volume Performed at [facility] [address] [place], [place] [address]    Culture   Final    NO GROWTH 5 DAYS Performed at [facility], [place] [address] [place], [place] [address]    Report Status 04/07/2020 FINAL  Final  Respiratory Panel by RT PCR (Flu A&B, Covid) - Nasopharyngeal Swab     Status: None   Collection Time: 04/02/20  5:32 PM   Specimen: Nasopharyngeal Swab  Result Value Ref Range Status   SARS Coronavirus 2 by RT PCR NEGATIVE NEGATIVE Final    Comment: (NOTE) SARS-CoV-2 target nucleic acids are NOT DETECTED.  The SARS-CoV-2 RNA is generally detectable in upper respiratoy specimens during the acute phase of infection. The lowest concentration of SARS-CoV-2 viral copies this assay can detect is 131 copies/mL. A negative result does not preclude SARS-Cov-2 infection and should not be used as the sole basis for treatment or other patient management decisions. A negative result may occur with  improper specimen collection/handling, submission of specimen other than nasopharyngeal swab, presence of viral mutation(s) within the areas targeted by this assay, and inadequate number of viral copies (<131 copies/mL). A negative result must be combined with clinical observations, patient history, and epidemiological information. The expected result is Negative.  Fact Sheet for Patients:  PinkCheek.be  Fact Sheet for Healthcare Providers:  GravelBags.it  This test is no t  yet approved or cleared by the [place] and  has been authorized for detection and/or diagnosis of SARS-CoV-2 by FDA under an Emergency Use Authorization (EUA). This EUA will remain  in effect (meaning this test can be used) for the duration of the COVID-19 declaration under Section 564(b)(1) of the Act, 21 U.S.C. section 360bbb-3(b)(1), unless the authorization is terminated  or revoked sooner.     Influenza A by PCR NEGATIVE NEGATIVE Final   Influenza B by PCR NEGATIVE NEGATIVE Final    Comment: (NOTE) The Xpert Xpress SARS-CoV-2/FLU/RSV assay is intended as an aid in  the diagnosis of influenza from Nasopharyngeal swab specimens and  should not be used as a sole basis for treatment. Nasal washings and  aspirates are unacceptable for Xpert Xpress SARS-CoV-2/FLU/RSV  testing.  Fact Sheet for Patients: PinkCheek.be  Fact Sheet for Healthcare Providers: GravelBags.it  This test is not yet approved or cleared by the [place] FDA and  has been authorized for detection and/or diagnosis of SARS-CoV-2 by  FDA under an Emergency Use Authorization (EUA). This EUA will remain  in effect (meaning this test can be used) for the duration of the  Covid-19 declaration under Section 564(b)(1) of the Act, 21  U.S.C. section 360bbb-3(b)(1), unless the authorization is  terminated or revoked. Performed at [facility], [place] [address] [place], [place] [address]   MRSA PCR Screening     Status: None   Collection Time: 04/02/20  8:05 PM   Specimen: Nasal Mucosa; Nasopharyngeal  Result Value Ref Range Status   MRSA by PCR NEGATIVE NEGATIVE Final    Comment:        The GeneXpert MRSA Assay (FDA approved for NASAL specimens only), is one component of a comprehensive MRSA colonization surveillance program. It is not intended to diagnose MRSA infection nor to guide or monitor treatment for MRSA infections. Performed at [facility], [place] [address] [place], [place] [address]          Radiology Studies: No results found.      Scheduled Meds: . amoxicillin-clavulanate  500 mg Per Tube TID  . chlorhexidine  15 mL Mouth Rinse BID  . enoxaparin (LOVENOX) injection  40 mg Subcutaneous Q12H  . mouth rinse  15 mL Mouth Rinse q12n4p  . senna-docusate  1 tablet  Per Tube QHS   Continuous Infusions: . famotidine (PEPCID) IV 20 mg (04/09/20 2204)  . feeding supplement (OSMOLITE 1.2 CAL) 1,000 mL (04/10/20 1450)     LOS: 8 days   Time spent: 62mins, continued goals of care discussion with son at bedside today, discussed with palliative care as well Greater than 50% of this time was spent in counseling, explanation of diagnosis, planning of further management, and coordination of care.  I have personally reviewed and interpreted on  04/10/2020 daily labs, I reviewed all nursing notes, pharmacy notes, ICU notes,  vitals, pertinent old records  I have discussed plan of care as described above with RN , patient and family on 04/10/2020  Voice Recognition /Dragon dictation system was used to create this note, attempts have been made to correct errors. Please contact the author with questions and/or clarifications.   Florencia Reasons, MD PhD FACP [facility]  Available via Epic secure chat 7am-7pm for nonurgent issues Please page for urgent issues To page the attending provider between 7A-7P or the covering provider during after hours 7P-7A, please log into the web site [email or web address] and access using universal [place]  password for that web site. If you do not have the password, please call the hospital operator.    04/10/2020, 4:57 PM

## 2020-04-10 NOTE — Progress Notes (Signed)
Daily Progress Note   Patient Name: Catherine Alexander       Date: 04/10/2020 DOB: [birth year]  Age: 69 y.o. MRN#: [id number] Attending Physician: Florencia Reasons, MD Primary Care Physician: Kerin Perna, NP Admit Date: 04/02/2020  Reason for Consultation/Follow-up: Establishing goals of care  Subjective: Patient not much awake alert in the mornings, more alert later on in the day, chart reviewed, discussed with Dr Erlinda Hong, appreciate SLP evaluation and recommendations, will have to monitor how much PO full liquid nectar thick diet the patient is able to tolerate.   Length of Stay: 8  Current Medications: Scheduled Meds:  . amoxicillin-clavulanate  500 mg Per Tube TID  . chlorhexidine  15 mL Mouth Rinse BID  . enoxaparin (LOVENOX) injection  40 mg Subcutaneous Q12H  . mouth rinse  15 mL Mouth Rinse q12n4p  . senna-docusate  1 tablet Per Tube QHS    Continuous Infusions: . famotidine (PEPCID) IV 20 mg (04/09/20 2204)  . feeding supplement (OSMOLITE 1.2 CAL) 1,000 mL (04/09/20 1646)    PRN Meds: acetaminophen, ondansetron (ZOFRAN) IV, polyethylene glycol  Physical Exam         Awake alert Appears frail Has muscle wasting upper and lower extremities Regular work of breathing Has NG tube  Vital Signs: BP 127/61 (BP Location: Left Arm)   Pulse 97   Temp (!) 97.5 F (36.4 C) (Oral)   Resp 14   Ht 5\' 4"  (1.626 m)   Wt 43.1 kg   SpO2 96%   BMI 16.31 kg/m  SpO2: SpO2: 96 % O2 Device: O2 Device: Room Air O2 Flow Rate:    Intake/output summary:   Intake/Output Summary (Last 24 hours) at 04/10/2020 1330 Last data filed at 04/10/2020 0535 Gross per 24 hour  Intake --  Output 650 ml  Net -650 ml   LBM: Last BM Date: 04/10/20 Baseline Weight: Weight: 33.7 kg Most recent weight:  Weight: 43.1 kg       Palliative Assessment/Data:      Patient Active Problem List   Diagnosis Date Noted  . Protein-calorie malnutrition, severe 04/04/2020  . Acute hypercapnic respiratory failure ([place]) 04/02/2020  . Dehydration 01/24/2020  . Elevated CK 01/24/2020  . Dementia with behavioral disturbance ([place]) 01/24/2020  . SDH (subdural hematoma) ([place]) 01/24/2020  . Parkinson disease ([place]) 01/24/2020  .  Abnormal foot color 01/24/2020    Palliative Care Assessment & Plan   Patient Profile:    Assessment: Advanced Parkinson's dementia Admitted with acute hypoxic respiratory failure deemed secondary to possible aspiration pneumonia Found to have acute DVT started on anticoagulation Severe malnutrition, failure to thrive Recommendations/Plan:  Modified barium swallow noted, recommendations for full liquid nectar thick noted, monitor hospital course and PO intake. Agree with DO NOT RESUSCITATE Monitor hospital course   Recommend skilled nursing facility rehabilitation attempt with palliative services towards the end of this hospitalization. Should the patient have acute precipitous decline or not show any signs of meaningful recovery, then would recommend more of a comfort based approach and would recommend residential hospice at that time.     Code Status:    Code Status Orders  (From admission, onward)         Start     Ordered   04/02/20 1807  Do not attempt resuscitation (DNR)  Continuous       Question Answer Comment  In the event of cardiac or respiratory ARREST Do not call a "code blue"   In the event of cardiac or respiratory ARREST Do not perform Intubation, CPR, defibrillation or ACLS   In the event of cardiac or respiratory ARREST Use medication by any route, position, wound care, and other measures to relive pain and suffering. May use oxygen, suction and manual treatment of airway obstruction as needed for comfort.      04/02/20 1806        Code  Status History    Date Active Date Inactive Code Status Order ID Comments User Context   01/24/2020 0601 01/25/2020 2310 Full Code [id number]  Shalhoub, Sherryll Burger, MD ED   Advance Care Planning Activity       Prognosis:   Unable to determine Guarded.  Discharge Planning:  To Be Determined  Care plan was discussed with   [facility] MD.    Thank you for allowing the Palliative Medicine Team to assist in the care of this patient.   Time In: 12 Time Out: 12.15 Total Time 15 Prolonged Time Billed  no       Greater than 50%  of this time was spent counseling and coordinating care related to the above assessment and plan.  Loistine Chance, MD  Please contact Palliative Medicine Team phone at [phone] for questions and concerns.

## 2020-04-11 LAB — COMPREHENSIVE METABOLIC PANEL
ALT: 16 U/L (ref 0–44)
AST: 17 U/L (ref 15–41)
Albumin: 3.4 g/dL — ABNORMAL LOW (ref 3.5–5.0)
Alkaline Phosphatase: 47 U/L (ref 38–126)
Anion gap: 7 (ref 5–15)
BUN: 13 mg/dL (ref 8–23)
CO2: 28 mmol/L (ref 22–32)
Calcium: 9 mg/dL (ref 8.9–10.3)
Chloride: 106 mmol/L (ref 98–111)
Creatinine, Ser: 0.73 mg/dL (ref 0.44–1.00)
GFR, Estimated: >60 mL/min (ref >60)
Glucose, Bld: 109 mg/dL — ABNORMAL HIGH (ref 70–99)
Potassium: 3.4 mmol/L — ABNORMAL LOW (ref 3.5–5.1)
Sodium: 141 mmol/L (ref 135–145)
Total Bilirubin: 0.6 mg/dL (ref 0.3–1.2)
Total Protein: 6 g/dL — ABNORMAL LOW (ref 6.5–8.1)

## 2020-04-11 LAB — CBC
HCT: 28.1 % — ABNORMAL LOW (ref 36.0–46.0)
Hemoglobin: 9.3 g/dL — ABNORMAL LOW (ref 12.0–15.0)
MCH: 31.4 pg (ref 26.0–34.0)
MCHC: 33.1 g/dL (ref 30.0–36.0)
MCV: 94.9 fL (ref 80.0–100.0)
Platelets: 134 10*3/uL — ABNORMAL LOW (ref 150–400)
RBC: 2.96 MIL/uL — ABNORMAL LOW (ref 3.87–5.11)
RDW: 14.1 % (ref 11.5–15.5)
WBC: 15.4 10*3/uL — ABNORMAL HIGH (ref 4.0–10.5)
nRBC: 0 % (ref 0.0–0.2)

## 2020-04-11 LAB — GLUCOSE, CAPILLARY
Glucose-Capillary: 116 mg/dL — ABNORMAL HIGH (ref 70–99)
Glucose-Capillary: 117 mg/dL — ABNORMAL HIGH (ref 70–99)
Glucose-Capillary: 121 mg/dL — ABNORMAL HIGH (ref 70–99)
Glucose-Capillary: 123 mg/dL — ABNORMAL HIGH (ref 70–99)
Glucose-Capillary: 125 mg/dL — ABNORMAL HIGH (ref 70–99)
Glucose-Capillary: 129 mg/dL — ABNORMAL HIGH (ref 70–99)

## 2020-04-11 LAB — MAGNESIUM: Magnesium: 2.1 mg/dL (ref 1.7–2.4)

## 2020-04-11 LAB — LACTIC ACID, PLASMA: Lactic Acid, Venous: 0.9 mmol/L (ref 0.5–1.9)

## 2020-04-11 MED ORDER — POTASSIUM CHLORIDE CRYS ER 20 MEQ PO TBCR
40.0000 meq | EXTENDED_RELEASE_TABLET | Freq: Once | ORAL | Status: AC
  Administered 2020-04-11: 40 meq via ORAL
  Filled 2020-04-11: qty 2

## 2020-04-11 MED ORDER — RESOURCE THICKENUP CLEAR PO POWD
ORAL | Status: DC | PRN
  Filled 2020-04-11 (×2): qty 125

## 2020-04-11 NOTE — Progress Notes (Signed)
Daily Progress Note   Patient Name: Catherine Alexander       Date: 04/11/2020 DOB: [birth year]  Age: 69 y.o. MRN#: [id number] Attending Physician: Shelly Coss, MD Primary Care Physician: Kerin Perna, NP Admit Date: 04/02/2020  Reason for Consultation/Follow-up: Establishing goals of care  Subjective: Patient is awake, tracks me in the room, in no distress, I have discussed with SLP as well as with TRH MD, this is a trial period for few days, on tube feedings as well as full liquid nectar thick as recommended by SLP. Recommend SNF rehab with palliative if ongoing stabilization, how ever, would recommend residential hospice if patient isn't meaningfully recovering and/or if she has severe symptom burden.    Length of Stay: 9  Current Medications: Scheduled Meds:  . amoxicillin-clavulanate  500 mg Per Tube TID  . chlorhexidine  15 mL Mouth Rinse BID  . enoxaparin (LOVENOX) injection  40 mg Subcutaneous Q12H  . free water  100 mL Per Tube Q8H  . mouth rinse  15 mL Mouth Rinse q12n4p  . senna-docusate  1 tablet Per Tube QHS    Continuous Infusions: . famotidine (PEPCID) IV 20 mg (04/11/20 0025)  . feeding supplement (OSMOLITE 1.2 CAL) 1,000 mL (04/11/20 0644)    PRN Meds: acetaminophen, ondansetron (ZOFRAN) IV, polyethylene glycol  Physical Exam         Awake alert Appears frail Has muscle wasting upper and lower extremities Regular work of breathing Has NG tube  Vital Signs: BP (!) 108/51 (BP Location: Left Arm)   Pulse 90   Temp 98.3 F (36.8 C) (Oral)   Resp 14   Ht 5\' 4"  (1.626 m)   Wt 41.2 kg   SpO2 97%   BMI 15.59 kg/m  SpO2: SpO2: 97 % O2 Device: O2 Device: Room Air O2 Flow Rate:    Intake/output summary:   Intake/Output Summary (Last 24 hours) at  04/11/2020 1212 Last data filed at 04/10/2020 2120 Gross per 24 hour  Intake --  Output 300 ml  Net -300 ml   LBM: Last BM Date: 04/10/20 Baseline Weight: Weight: 33.7 kg Most recent weight: Weight: 41.2 kg       Palliative Assessment/Data:      Patient Active Problem List   Diagnosis Date Noted  . Protein-calorie malnutrition, severe 04/04/2020  .  Acute hypercapnic respiratory failure ([place]) 04/02/2020  . Dehydration 01/24/2020  . Elevated CK 01/24/2020  . Dementia with behavioral disturbance ([place]) 01/24/2020  . SDH (subdural hematoma) ([place]) 01/24/2020  . Parkinson disease ([place]) 01/24/2020  . Abnormal foot color 01/24/2020    Palliative Care Assessment & Plan   Patient Profile:    Assessment: Advanced Parkinson's dementia Admitted with acute hypoxic respiratory failure deemed secondary to possible aspiration pneumonia Found to have acute DVT started on anticoagulation Severe malnutrition, failure to thrive Recommendations/Plan:  Modified barium swallow noted, discussed with SLP, recommendations for full liquid nectar thick noted, monitor hospital course and PO intake. Agree with DO NOT RESUSCITATE   Recommend skilled nursing facility rehabilitation attempt with palliative services towards the end of this hospitalization. Should the patient have acute precipitous decline or not show any signs of meaningful recovery, then would recommend more of a comfort based approach and would recommend residential hospice at that time.     Code Status:    Code Status Orders  (From admission, onward)         Start     Ordered   04/02/20 1807  Do not attempt resuscitation (DNR)  Continuous       Question Answer Comment  In the event of cardiac or respiratory ARREST Do not call a "code blue"   In the event of cardiac or respiratory ARREST Do not perform Intubation, CPR, defibrillation or ACLS   In the event of cardiac or respiratory ARREST Use medication by any route,  position, wound care, and other measures to relive pain and suffering. May use oxygen, suction and manual treatment of airway obstruction as needed for comfort.      04/02/20 1806        Code Status History    Date Active Date Inactive Code Status Order ID Comments User Context   01/24/2020 0601 01/25/2020 2310 Full Code [id number]  Shalhoub, Sherryll Burger, MD ED   Advance Care Planning Activity       Prognosis:   Unable to determine Guarded.  Discharge Planning:  To Be Determined  Care plan was discussed with SLP and TRH MD.    Thank you for allowing the Palliative Medicine Team to assist in the care of this patient.   Time In: 9 Time Out: 9.25 Total Time 25 Prolonged Time Billed  no       Greater than 50%  of this time was spent counseling and coordinating care related to the above assessment and plan.  Loistine Chance, MD  Please contact Palliative Medicine Team phone at [phone] for questions and concerns.

## 2020-04-11 NOTE — Progress Notes (Signed)
PROGRESS NOTE    Catherine Alexander  QQP:[id number] DOB: 04/09/1951 DOA: 04/02/2020 PCP: Kerin Perna, NP   Chief Complain: Failure to thrive  Brief Narrative: Patient is a 69 year old female with history of advanced Parkinson dementia who was brought to the emergency department when she was found unresponsive at home on 04/02/2020.  She has history of dementia and had poor oral intake for several days prior to admission.  On presentation she was hypotensive, unresponsive with agonal breathing so she was intubated and was sent to ICU.  She has been extubated and her respiratory status is currently stable.  Hospital course prolonged with persistent dysphagia needing feeding tube.  There was plan for skilled nursing facility placement but due to her poor quality of life, severe malnutrition, dysphagia, dementia, we strongly recommend residential hospice  Assessment & Plan:   Active Problems:   Acute hypercapnic respiratory failure (HCC)   Protein-calorie malnutrition, severe   Acute hypoxic respiratory failure: Suspected to be from aspiration pneumonia.  Intubated in the emergency department, now extubated on room air.  She was initially on Unasyn which has been changed to Augmentin.  Dysphagia/aspiration/failure to thrive: Unable to swallow.  Speech therapist following and recommending only nectar thick liquid.  Will follow speech therapy recommendation so that we can discontinue feeding tube.  Currently also in  Tube feeding.  Dietitian following.  Acute DVT: Currently on Lovenox.  Unable to swallow pills.  If able to swallow, will transition to Eliquis on DC  Leukocytosis: Mild.  Continue to monitor  Acute thrombocytopenia: No signs of bleeding.  Thrombocytopenia likely associated  with acute DVT.  Continue monitoring.  Currently hemoglobin stable.  Hypernatremia: Resolved  Hypokalemia: Being supplemented with potassium.  AKI: Resolved with IV fluids  Severe protein calorie  malnutrition: BMI of 15.5  Goals of care/Parkinson's disease/dementia: Multiple comorbidities in this demented patient.  Overall prognosis very poor.  Palliative care closely following and recommended outpatient follow-up.  She is a DNR. I strongly recommend residential hospice for this patient.  Disposition: Currently on feeding tube making the situation complicated for discharge.  Case manager closely following.  Patient does not have insurance, is not a [place] citizen.    Nutrition Problem: Severe Malnutrition Etiology: chronic illness (Parkinson's dementia)      DVT prophylaxis: Lovenox Code Status: DNR Family Communication: Son at bedside Status is: Inpatient  Remains inpatient appropriate because:Unsafe d/c plan   Dispo: The patient is from: Home              Anticipated d/c is to: SNF/residential Hospice              Anticipated d/c date is: Not sure at this point              Patient currently not medically stable for discharge    Consultants: PCCM  Procedures: Intubation  Antimicrobials:  Anti-infectives (From admission, onward)   Start     Dose/Rate Route Frequency Ordered Stop   04/09/20 1000  amoxicillin-clavulanate (AUGMENTIN) 250-62.5 MG/5ML suspension 500 mg        500 mg Per Tube 3 times daily 04/09/20 0835     04/08/20 1000  amoxicillin-clavulanate (AUGMENTIN) 500-125 MG per tablet 500 mg  Status:  Discontinued        1 tablet Oral 3 times daily 04/08/20 0808 04/09/20 0836   04/06/20 1100  amoxicillin-clavulanate (AUGMENTIN) 500-125 MG per tablet 500 mg  Status:  Discontinued        1 tablet Oral  2 times daily 04/06/20 1013 04/08/20 0808   04/05/20 1800  amoxicillin-clavulanate (AUGMENTIN) 875-125 MG per tablet 1 tablet  Status:  Discontinued        1 tablet Per Tube Every 12 hours 04/05/20 1454 04/06/20 1013   04/03/20 1600  ampicillin-sulbactam (UNASYN) 1.5 g in sodium chloride 0.9 % 100 mL IVPB  Status:  Discontinued        1.5 g 200 mL/hr over 30  Minutes Intravenous Every 12 hours 04/03/20 1518 04/05/20 1454      Subjective: Patient seen and examined at the bedside this afternoon.  Hemodynamically stable during my evaluation, very weak, lethargic, lying in the bed.  Son at the bedside.  NG tube on place.  Opens eyes when calling her name but did not talk or move.  Objective: Vitals:   04/10/20 0515 04/10/20 1419 04/10/20 2116 04/11/20 0409  BP: 127/61 117/71 (!) 117/38 (!) 108/51  Pulse: 97 89 (!) 105 90  Resp: 14 20 14 14   Temp: (!) 97.5 F (36.4 C) 98 F (36.7 C) 98.1 F (36.7 C) 98.3 F (36.8 C)  TempSrc: Oral Oral Oral Oral  SpO2: 96% 97% 97% 97%  Weight: 43.1 kg   41.2 kg  Height:        Intake/Output Summary (Last 24 hours) at 04/11/2020 1417 Last data filed at 04/10/2020 2120 Gross per 24 hour  Intake --  Output 300 ml  Net -300 ml   Filed Weights   04/09/20 0452 04/10/20 0515 04/11/20 0409  Weight: 46.4 kg 43.1 kg 41.2 kg    Examination:  General exam: Severely malnourished, cachectic, chronically ill looking HEENT:feeding tube Respiratory system: Bilateral equal air entry, normal vesicular breath sounds, no wheezes or crackles  Cardiovascular system: S1 & S2 heard, RRR. No JVD, murmurs, rubs, gallops or clicks. No pedal edema. Gastrointestinal system: Abdomen is nondistended, soft and nontender. No organomegaly or masses felt. Normal bowel sounds heard. Central nervous system: Not Alert and oriented. Extremities: No edema, no clubbing ,no cyanosis, wasting of muscles Skin: No rashes, lesions or ulcers,no icterus ,no pallor   Data Reviewed: I have personally reviewed following labs and imaging studies  CBC: Recent Labs  Lab 04/07/20 0536 04/08/20 0518 04/09/20 0735 04/10/20 0638 04/11/20 0730  WBC 13.3* 9.2 9.3 14.0* 15.4*  NEUTROABS 12.2*  --   --   --   --   HGB 11.6* 11.1* 9.5* 9.9* 9.3*  HCT 35.6* 34.5* 28.9* 29.9* 28.1*  MCV 98.1 95.8 95.7 94.9 94.9  PLT 66* 94* 86* 107* 134*    Basic Metabolic Panel: Recent Labs  Lab 04/04/20 1805 04/04/20 1805 04/05/20 0243 04/05/20 0243 04/05/20 1241 04/05/20 1241 04/05/20 1707 04/07/20 0536 04/08/20 0518 04/09/20 0735 04/10/20 0638 04/11/20 0525  NA  --   --   --   --  139   < >  --  137 138 134* 133* 141  K  --   --   --   --  4.1   < >  --  4.2 4.2 3.9 4.1 3.4*  CL  --   --   --   --  112*   < >  --  109 108 105 102 106  CO2  --   --   --   --  19*   < >  --  21* 24 24 24 28   GLUCOSE  --   --   --   --  142*   < >  --  95 117* 127* 115* 109*  BUN  --   --   --   --  42*   < >  --  16 15 13 13 13   CREATININE  --   --   --   --  1.15*   < >  --  0.59 0.52 0.45 0.44 0.73  CALCIUM  --   --   --   --  8.1*   < >  --  8.0* 8.2* 7.6* 7.6* 9.0  MG 2.2   < > 2.1   < > 2.2   < > 2.2 1.9 1.9 1.8 1.7 2.1  PHOS 2.2*  --  2.7  --  2.9  --  2.6  --   --   --   --   --    < > = values in this interval not displayed.   GFR: Estimated Creatinine Clearance: 43.2 mL/min (by C-G formula based on SCr of 0.73 mg/dL). Liver Function Tests: Recent Labs  Lab 04/09/20 0735 04/10/20 0638 04/11/20 0525  AST 43* 48* 17  ALT 56* 51* 16  ALKPHOS 52 52 47  BILITOT 0.6 0.7 0.6  PROT 5.3* 5.3* 6.0*  ALBUMIN 1.7* 1.6* 3.4*   No results for input(s): LIPASE, AMYLASE in the last 168 hours. No results for input(s): AMMONIA in the last 168 hours. Coagulation Profile: Recent Labs  Lab 04/07/20 1525  INR 1.0   Cardiac Enzymes: No results for input(s): CKTOTAL, CKMB, CKMBINDEX, TROPONINI in the last 168 hours. BNP (last 3 results) No results for input(s): PROBNP in the last 8760 hours. HbA1C: No results for input(s): HGBA1C in the last 72 hours. CBG: Recent Labs  Lab 04/10/20 2026 04/11/20 0004 04/11/20 0411 04/11/20 0730 04/11/20 1153  GLUCAP 138* 117* 129* 121* 116*   Lipid Profile: No results for input(s): CHOL, HDL, LDLCALC, TRIG, CHOLHDL, LDLDIRECT in the last 72 hours. Thyroid Function Tests: No results for  input(s): TSH, T4TOTAL, FREET4, T3FREE, THYROIDAB in the last 72 hours. Anemia Panel: No results for input(s): VITAMINB12, FOLATE, FERRITIN, TIBC, IRON, RETICCTPCT in the last 72 hours. Sepsis Labs: Recent Labs  Lab 04/09/20 0735 04/11/20 0525  LATICACIDVEN 1.5 0.9    Recent Results (from the past 240 hour(s))  Culture, blood (routine x 2)     Status: None   Collection Time: 04/02/20  4:24 PM   Specimen: BLOOD  Result Value Ref Range Status   Specimen Description   Final    BLOOD RIGHT ANTECUBITAL Performed at [facility], [place] [address], [place], [place] [address]    Special Requests   Final    BOTTLES DRAWN AEROBIC ONLY Blood Culture results may not be optimal due to an inadequate volume of blood received in culture bottles Performed at [place] [address] [place], [place] [address]    Culture   Final    NO GROWTH 5 DAYS Performed at [facility], [place] [address] [place], [place] [address]    Report Status 04/07/2020 FINAL  Final  Culture, blood (routine x 2)     Status: None   Collection Time: 04/02/20  4:29 PM   Specimen: BLOOD  Result Value Ref Range Status   Specimen Description   Final    BLOOD RIGHT ANTECUBITAL Performed at [place] [address], [place], [place] [address]    Special Requests   Final    BOTTLES DRAWN AEROBIC AND ANAEROBIC Blood Culture adequate volume  Performed at [facility], [place] [address], [place], [place] [address]    Culture   Final    NO GROWTH 5 DAYS Performed at [facility], [place] [address] [place], [place] [address]    Report Status 04/07/2020 FINAL  Final  Respiratory Panel by RT PCR (Flu A&B, Covid) - Nasopharyngeal Swab     Status: None   Collection Time: 04/02/20  5:32 PM   Specimen: Nasopharyngeal Swab  Result Value Ref Range Status   SARS Coronavirus 2 by RT PCR NEGATIVE NEGATIVE Final    Comment: (NOTE) SARS-CoV-2 target nucleic  acids are NOT DETECTED.  The SARS-CoV-2 RNA is generally detectable in upper respiratoy specimens during the acute phase of infection. The lowest concentration of SARS-CoV-2 viral copies this assay can detect is 131 copies/mL. A negative result does not preclude SARS-Cov-2 infection and should not be used as the sole basis for treatment or other patient management decisions. A negative result may occur with  improper specimen collection/handling, submission of specimen other than nasopharyngeal swab, presence of viral mutation(s) within the areas targeted by this assay, and inadequate number of viral copies (<131 copies/mL). A negative result must be combined with clinical observations, patient history, and epidemiological information. The expected result is Negative.  Fact Sheet for Patients:  PinkCheek.be  Fact Sheet for Healthcare Providers:  GravelBags.it  This test is no t yet approved or cleared by the [place] FDA and  has been authorized for detection and/or diagnosis of SARS-CoV-2 by FDA under an Emergency Use Authorization (EUA). This EUA will remain  in effect (meaning this test can be used) for the duration of the COVID-19 declaration under Section 564(b)(1) of the Act, 21 U.S.C. section 360bbb-3(b)(1), unless the authorization is terminated or revoked sooner.     Influenza A by PCR NEGATIVE NEGATIVE Final   Influenza B by PCR NEGATIVE NEGATIVE Final    Comment: (NOTE) The Xpert Xpress SARS-CoV-2/FLU/RSV assay is intended as an aid in  the diagnosis of influenza from Nasopharyngeal swab specimens and  should not be used as a sole basis for treatment. Nasal washings and  aspirates are unacceptable for Xpert Xpress SARS-CoV-2/FLU/RSV  testing.  Fact Sheet for Patients: PinkCheek.be  Fact Sheet for Healthcare Providers: GravelBags.it  This test  is not yet approved or cleared by the [place] FDA and  has been authorized for detection and/or diagnosis of SARS-CoV-2 by  FDA under an Emergency Use Authorization (EUA). This EUA will remain  in effect (meaning this test can be used) for the duration of the  Covid-19 declaration under Section 564(b)(1) of the Act, 21  U.S.C. section 360bbb-3(b)(1), unless the authorization is  terminated or revoked. Performed at [facility], [place] [address], [place], [place] [address]   MRSA PCR Screening     Status: None   Collection Time: 04/02/20  8:05 PM   Specimen: Nasal Mucosa; Nasopharyngeal  Result Value Ref Range Status   MRSA by PCR NEGATIVE NEGATIVE Final    Comment:        The GeneXpert MRSA Assay (FDA approved for NASAL specimens only), is one component of a comprehensive MRSA colonization surveillance program. It is not intended to diagnose MRSA infection nor to guide or monitor treatment for MRSA infections. Performed at [facility], [place] [address], [place], [place] [address]          Radiology Studies: No results found.      Scheduled Meds: .  amoxicillin-clavulanate  500 mg Per Tube TID  . chlorhexidine  15 mL Mouth Rinse BID  . enoxaparin (LOVENOX) injection  40 mg Subcutaneous Q12H  . free water  100 mL Per Tube Q8H  . mouth rinse  15 mL Mouth Rinse q12n4p  . senna-docusate  1 tablet Per Tube QHS   Continuous Infusions: . famotidine (PEPCID) IV 20 mg (04/11/20 0025)  . feeding supplement (OSMOLITE 1.2 CAL) 1,000 mL (04/11/20 0644)     LOS: 9 days    Time spent: 35 mins,More than 50% of that time was spent in counseling and/or coordination of care.      Shelly Coss, MD [facility] P11/15/2021, 2:17 PM

## 2020-04-11 NOTE — Progress Notes (Signed)
ANTICOAGULATION CONSULT NOTE  Pharmacy Consult for Enoxaparin Indication: DVT  No Known Allergies  Patient Measurements: Height: 5\' 4"  (162.6 cm) Weight: 41.2 kg (90 lb 13.3 oz) IBW/kg (Calculated) : 54.7 Heparin Dosing Weight: n/a  Vital Signs: Temp: 98.3 F (36.8 C) (11/15 0409) Temp Source: Oral (11/15 0409) BP: 108/51 (11/15 0409) Pulse Rate: 90 (11/15 0409)  Labs: Recent Labs    04/09/20 0735 04/09/20 0735 04/10/20 0638 04/11/20 0525 04/11/20 0730  HGB 9.5*   < > 9.9*  --  9.3*  HCT 28.9*  --  29.9*  --  28.1*  PLT 86*  --  107*  --  134*  CREATININE 0.45  --  0.44 0.73  --    < > = values in this interval not displayed.    Estimated Creatinine Clearance: 43.2 mL/min (by C-G formula based on SCr of 0.73 mg/dL).   Medical History: Past Medical History:  Diagnosis Date  . Dementia with behavioral disturbance ([place]) 01/24/2020    Medications:  No medications prior to admission.   Scheduled:  . amoxicillin-clavulanate  500 mg Per Tube TID  . chlorhexidine  15 mL Mouth Rinse BID  . enoxaparin (LOVENOX) injection  40 mg Subcutaneous Q12H  . free water  100 mL Per Tube Q8H  . mouth rinse  15 mL Mouth Rinse q12n4p  . potassium chloride  40 mEq Oral Once  . senna-docusate  1 tablet Per Tube QHS   Infusions:  . famotidine (PEPCID) IV 20 mg (04/11/20 0025)  . feeding supplement (OSMOLITE 1.2 CAL) 1,000 mL (04/11/20 0644)   PRN: acetaminophen, ondansetron (ZOFRAN) IV, polyethylene glycol  Assessment: 69 y.o. female admitted 04/02/2020 after being found down at home, PMH advanced Parkinson's/dementia, FTT. Bilat LE doppler shows L DVT. Pharmacy to dose Lovenox.   CBC: Hgb low but stable, Plt improving  SCr: AKI on admission, now resolved  Previous anticoagulation: none  Plan transition to apixaban pending goals of care  Goal of Therapy: Anti-Xa level 0.6-1 units/ml 4hrs after LMWH dose given  Plan:  Continue Lovenox 40 mg (1 mg/kg) SQ q12  hr  Monitor renal function and daily CBC   Monitor for signs of bleeding or worsening thrombosis  Peggyann Juba, PharmD, BCPS Pharmacy: [phone] 04/11/2020, 8:06 AM

## 2020-04-11 NOTE — Progress Notes (Signed)
PMT no charge note.  I met with the patient's son at bedside.  We reviewed hospital records.  We discussed about patient's current condition, underlying serious illness.  Discussed extensively about end-stage irreversible nature of Parkinson's disease.  Discussed about dysphagia and issues pertaining to aspiration and recurrent infections in detail.  Brief life review about the patient's life in [place] also discussed.  All of his questions addressed to the best of my ability.  Overall, patient son states that allowing for comfort feeds/comfort measures and pursuing residential hospice would be most in line with the patient's wishes.  However, he wishes to discuss with the patient's sister in [place] and inform her about the patient's ongoing decline. Palliative care will follow up in a.m. on 04-12-20 as well.  Continue trial of NG tube and tube feeds for now. 15 minutes spent Loistine Chance MD [facility] palliative.

## 2020-04-11 NOTE — Progress Notes (Signed)
Nutrition Follow-up  DOCUMENTATION CODES:   Severe malnutrition in context of chronic illness, Underweight  INTERVENTION:   -Osmolite 1.2 @ 55 ml/hr via small bore NGT -Provides 1584 kcals, 73g protein and 1082 ml H2O  -Free water per MD, currently 100 ml every 8 hours.  NUTRITION DIAGNOSIS:   Severe Malnutrition related to chronic illness (Parkinson's dementia) as evidenced by severe fat depletion, severe muscle depletion.  Ongoing.  GOAL:   Patient will meet greater than or equal to 90% of their needs  Meeting with TF  MONITOR:   TF tolerance, Labs, Weight trends, PO intake, GOC  ASSESSMENT:   69 year old female with medical history of advanced Parkinson's dementia with behavioral disturbances. Patient presented to the ED on 11/6 after being found unresponsive by her son. She was intubated in the ED.  11/6: admitted, intubated 11/8: extubated 11/13: s/p MBS, diet advanced to full nectar thick liquids  Patient continues to receive Osmolite 1.2 @ 55 ml/hr via small bore NGT. Pt tolerating. Now can have nectar thick liquids only PO. Patient continues to be alert/oriented x 1. Palliative care following.  Admission weight: 74 lbs. Current weight: 90 lbs.  Medications: KLOR-CON, Senokot  Labs reviewed: CBGs: 121-129 Low K  Diet Order:   Diet Order            Diet NPO time specified  Diet effective now                 EDUCATION NEEDS:   No education needs have been identified at this time  Skin:  Skin Assessment: Reviewed RN Assessment  Last BM:  11/8 (type 6)  Height:   Ht Readings from Last 1 Encounters:  04/02/20 5\' 4"  (1.626 m)    Weight:   Wt Readings from Last 1 Encounters:  04/11/20 41.2 kg   BMI:  Body mass index is 15.59 kg/m.  Estimated Nutritional Needs:   Kcal:  1450-1650 kcal  Protein:  65-75 grams  Fluid:  >/= 2 L/day  Clayton Bibles, MS, RD, LDN Inpatient Clinical Dietitian Contact information available via Amion

## 2020-04-11 NOTE — Progress Notes (Signed)
SLP Cancellation Note  Patient Details Name: Catherine Alexander MRN: [id number] DOB: [birth year]   Cancelled treatment:       Reason Eval/Treat Not Completed: Other (comment) (po diet initiated just this morning late, will continue efforts) [name], MS [facility] SLP [place] Office [phone] Pager [phone]    [name] 04/11/2020, 5:39 PM

## 2020-04-12 LAB — GLUCOSE, CAPILLARY
Glucose-Capillary: 145 mg/dL — ABNORMAL HIGH (ref 70–99)
Glucose-Capillary: 117 mg/dL — ABNORMAL HIGH (ref 70–99)
Glucose-Capillary: 142 mg/dL — ABNORMAL HIGH (ref 70–99)
Glucose-Capillary: 112 mg/dL — ABNORMAL HIGH (ref 70–99)
Glucose-Capillary: 84 mg/dL (ref 70–99)

## 2020-04-12 LAB — CBC
HCT: 27.1 % — ABNORMAL LOW (ref 36.0–46.0)
Hemoglobin: 8.8 g/dL — ABNORMAL LOW (ref 12.0–15.0)
MCH: 31.9 pg (ref 26.0–34.0)
MCHC: 32.5 g/dL (ref 30.0–36.0)
MCV: 98.2 fL (ref 80.0–100.0)
Platelets: 142 10*3/uL — ABNORMAL LOW (ref 150–400)
RBC: 2.76 MIL/uL — ABNORMAL LOW (ref 3.87–5.11)
RDW: 14.2 % (ref 11.5–15.5)
WBC: 13.4 10*3/uL — ABNORMAL HIGH (ref 4.0–10.5)
nRBC: 0 % (ref 0.0–0.2)

## 2020-04-12 MED ORDER — DEXTROSE IN LACTATED RINGERS 5 % IV SOLN: INTRAVENOUS | Status: AC

## 2020-04-12 NOTE — Progress Notes (Signed)
Daily Progress Note   Patient Name: Catherine Alexander       Date: 04/12/2020 DOB: 02-Mar-1951  Age: 69 y.o. MRN#: [id number] Attending Physician: Shelly Coss, MD Primary Care Physician: Kerin Perna, NP Admit Date: 04/02/2020  Reason for Consultation/Follow-up: Establishing goals of care  Subjective: I saw and examined Catherine Alexander today.  She was awake and alert and tracked me in the room but otherwise did not interact.    I called and was able to reach her son.  He has been discussing with case management and hospice liaison today and plan is for transition to residential hospice for end of life care.  He is tied up today in classes but will be present in the hospital from 0900 to 1000 tomorrow morning to complete paperwork for transition to [facility].  We discussed care plan for comfort and that tube feeds would not be continued on discharge.  I recommended stopping these in order to get NGT removed.  He would prefer that current regimen, including NGT and sugar checks remain in place in the hospital but understands that these will be discontinued at residential hospice.    We discussed prior notes that her family was hopeful that she would make it to her 69th birthday.  He tells me that the DOB in chart is "not correct" for how her culture determines her birthday as she uses lunar calendar and her birthday will actually be sometime the week before Christmas.  He states understanding that she is not expected to make it to this birthday.   Length of Stay: 10  Current Medications: Scheduled Meds:  . chlorhexidine  15 mL Mouth Rinse BID  . free water  100 mL Per Tube Q8H  . mouth rinse  15 mL Mouth Rinse q12n4p  . senna-docusate  1 tablet Per Tube QHS    Continuous Infusions: .  famotidine (PEPCID) IV 20 mg (04/11/20 2223)    PRN Meds: acetaminophen, ondansetron (ZOFRAN) IV, polyethylene glycol, Resource ThickenUp Clear  Physical Exam         Awake alert Appears frail Has muscle wasting upper and lower extremities Regular work of breathing Has NG tube  Vital Signs: BP (!) 128/52 (BP Location: Right Arm)   Pulse 93   Temp 98.1 F (36.7 C) (Oral)   Resp 16  Ht 5\' 4"  (1.626 m)   Wt 41.2 kg   SpO2 96%   BMI 15.59 kg/m  SpO2: SpO2: 96 % O2 Device: O2 Device: Room Air O2 Flow Rate:    Intake/output summary:   Intake/Output Summary (Last 24 hours) at 04/12/2020 1546 Last data filed at 04/11/2020 2053 Gross per 24 hour  Intake --  Output 600 ml  Net -600 ml   LBM: Last BM Date: 04/12/20 Baseline Weight: Weight: 33.7 kg Most recent weight: Weight: 41.2 kg       Palliative Assessment/Data:      Patient Active Problem List   Diagnosis Date Noted  . Protein-calorie malnutrition, severe 04/04/2020  . Acute hypercapnic respiratory failure ([place]) 04/02/2020  . Dehydration 01/24/2020  . Elevated CK 01/24/2020  . Dementia with behavioral disturbance ([place]) 01/24/2020  . SDH (subdural hematoma) ([place]) 01/24/2020  . Parkinson disease ([place]) 01/24/2020  . Abnormal foot color 01/24/2020    Palliative Care Assessment & Plan   Patient Profile:    Assessment: Advanced Parkinson's dementia Admitted with acute hypoxic respiratory failure deemed secondary to possible aspiration pneumonia Found to have acute DVT started on anticoagulation Severe malnutrition, failure to thrive Recommendations/Plan: - DNR - Plan for transition to residential hospice for end of life care.  Her son is planning to meet with hospice liaison tomorrow morning between 9-10AM.  Likely to [facility] tomorrow.   - Continue current care until discharge per family preference to maintain current therapies until discharge.     Code Status:    Code Status Orders  (From  admission, onward)         Start     Ordered   04/02/20 1807  Do not attempt resuscitation (DNR)  Continuous       Question Answer Comment  In the event of cardiac or respiratory ARREST Do not call a "code blue"   In the event of cardiac or respiratory ARREST Do not perform Intubation, CPR, defibrillation or ACLS   In the event of cardiac or respiratory ARREST Use medication by any route, position, wound care, and other measures to relive pain and suffering. May use oxygen, suction and manual treatment of airway obstruction as needed for comfort.      04/02/20 1806        Code Status History    Date Active Date Inactive Code Status Order ID Comments User Context   01/24/2020 0601 01/25/2020 2310 Full Code [id number]  Shalhoub, Sherryll Burger, MD ED   Advance Care Planning Activity       Prognosis:   Unable to determine Guarded.  Discharge Planning:  To Be Determined  Care plan was discussed with SLP and TRH MD.    Thank you for allowing the Palliative Medicine Team to assist in the care of this patient.   Time In: 1420 Time Out: 1500 Total Time 40 Prolonged Time Billed  no    Greater than 50%  of this time was spent counseling and coordinating care related to the above assessment and plan.   Micheline Rough, MD  Please contact Palliative Medicine Team phone at [phone] for questions and concerns.

## 2020-04-12 NOTE — Progress Notes (Addendum)
Manufacturing engineer [facility]) Hospital Liaison note.      Received request from [place] for family interest in [facility]. Patient information has been forwarded to [facility] for review.  [facility] has offered family a bed however son is unable to do transfer today or sign consents due to his schedule. Would like to follow up in the morning with D/C tomorrow.    A Please do not hesitate to call with questions.     Thank you,    Clementeen Hoof, BSN, RN     [place] (listed on [place] under [place])     [phone]

## 2020-04-12 NOTE — TOC Transition Note (Signed)
Transition of Care [facility]) - CM/SW Discharge Note   Patient Details  Name: Catherine Alexander MRN: [id number] Date of Birth: [birth year]  Transition of Care [facility]) CM/SW Contact:  Lynnell Catalan, RN Phone Number: 04/12/2020, 1:35 PM   Clinical Narrative:    Spoke with son at bedside at length both yesterday and today for dc planning. Son states that he knows that he can't care for his mom at home any more and just wants her to "die peacefully in her sleep".  Attending and PMT have both spoken with son as well and all are in agreement that residential hospice is the best venue for pt. Son chooses [employer] [facility] as it is close to his work Administrator, sports). Referral called in to [facility] TOC will continue to assist with DC plan.   Final next level of care: [place] Barriers to Discharge: No Barriers Identified   Patient Goals and CMS Choice Patient states their goals for this hospitalization and ongoing recovery are:: For mom to die peacefully CMS Medicare.gov Compare Post Acute Care list provided to:: Patient Represenative (must comment) Choice offered to / list presented to : Adult Children  Discharge Plan and Services   Discharge Planning Services: CM Consult Post Acute Care Choice: Hospice                 Readmission Risk Interventions No flowsheet data found.

## 2020-04-12 NOTE — Progress Notes (Signed)
PROGRESS NOTE    Catherine Alexander  XLK:[id number] DOB: 06/14/1950 DOA: 04/02/2020 PCP: Kerin Perna, NP   Chief Complain: Failure to thrive  Brief Narrative: Patient is a 69 year old female with history of advanced Parkinson dementia who was brought to the emergency department when she was found unresponsive at home on 04/02/2020.  She has history of dementia and had poor oral intake for several days prior to admission.  On presentation she was hypotensive, unresponsive with agonal breathing so she was intubated and was sent to ICU.  She has been extubated and her respiratory status is currently stable.  Hospital course prolonged with persistent dysphagia needing feeding tube.  There was plan for skilled nursing facility placement but due to her poor quality of life, severe malnutrition, dysphagia, dementia, we strongly recommended residential hospice.  Plan for residential hospice discharge   Assessment & Plan:   Active Problems:   Acute hypercapnic respiratory failure (HCC)   Protein-calorie malnutrition, severe   Acute hypoxic respiratory failure: Suspected to be from aspiration pneumonia.  Intubated in the emergency department, now extubated on room air.  She was initially on Unasyn which has been changed to Augmentin.  Dysphagia/aspiration/failure to thrive: Unable to swallow.  Speech therapist following and recommending only nectar thick liquid.  Feeding tube to be discontinued today for residential hospice  Acute DVT: Was on Lovenox.  Will discontinue anticoagulation  Acute thrombocytopenia: No signs of bleeding.  Thrombocytopenia likely associated  with acute DVT.   Currently hemoglobin stable.  Hypernatremia: Resolved  Hypokalemia:Was supplemented with potassium.  AKI: Resolved with IV fluids  Severe protein calorie malnutrition: BMI of 15.5  Goals of care/Parkinson's disease/dementia: Multiple comorbidities in this demented patient.  Overall prognosis very poor.   Palliative care closely following and recommended outpatient follow-up.  She is a DNR. We strongly recommend residential hospice for this patient..  Patient's son is agreeable to the idea of residential hospice     Nutrition Problem: Severe Malnutrition Etiology: chronic illness (Parkinson's dementia)      DVT prophylaxis: Lovenox Code Status: DNR Family Communication: Son at bedside Status is: Inpatient  Remains inpatient appropriate because:Unsafe d/c plan   Dispo: The patient is from: Home              Anticipated d/c is [id number]              Anticipated d/c date is: tomorrow     Consultants: PCCM  Procedures: Intubation  Antimicrobials:  Anti-infectives (From admission, onward)   Start     Dose/Rate Route Frequency Ordered Stop   04/09/20 1000  amoxicillin-clavulanate (AUGMENTIN) 250-62.5 MG/5ML suspension 500 mg        500 mg Per Tube 3 times daily 04/09/20 0835     04/08/20 1000  amoxicillin-clavulanate (AUGMENTIN) 500-125 MG per tablet 500 mg  Status:  Discontinued        1 tablet Oral 3 times daily 04/08/20 0808 04/09/20 0836   04/06/20 1100  amoxicillin-clavulanate (AUGMENTIN) 500-125 MG per tablet 500 mg  Status:  Discontinued        1 tablet Oral 2 times daily 04/06/20 1013 04/08/20 0808   04/05/20 1800  amoxicillin-clavulanate (AUGMENTIN) 875-125 MG per tablet 1 tablet  Status:  Discontinued        1 tablet Per Tube Every 12 hours 04/05/20 1454 04/06/20 1013   04/03/20 1600  ampicillin-sulbactam (UNASYN) 1.5 g in sodium chloride 0.9 % 100 mL IVPB  Status:  Discontinued  1.5 g 200 mL/hr over 30 Minutes Intravenous Every 12 hours 04/03/20 1518 04/05/20 1454      Subjective: Patient seen and examined at the bedside this morning.  Same as yesterday.  Lying on the bed, very weak, unable to move.  Opens her eyes intermittently.  Son at the bedside  Objective: Vitals:   04/11/20 0409 04/11/20 1621 04/11/20 2051 04/12/20 0422  BP: (!)  108/51 (!) 105/57 (!) 112/50 133/61  Pulse: 90 96 86 86  Resp: 14 19 20 20   Temp: 98.3 F (36.8 C) 98.1 F (36.7 C) 97.9 F (36.6 C) 97.9 F (36.6 C)  TempSrc: Oral Oral Axillary Oral  SpO2: 97% 97% 96% 92%  Weight: 41.2 kg     Height:        Intake/Output Summary (Last 24 hours) at 04/12/2020 0856 Last data filed at 04/11/2020 2053 Gross per 24 hour  Intake --  Output 600 ml  Net -600 ml   Filed Weights   04/09/20 0452 04/10/20 0515 04/11/20 0409  Weight: 46.4 kg 43.1 kg 41.2 kg    Examination: General exam: Cachectic, very ill looking HEENT: Feeding tube Respiratory system: Bilateral equal air entry, normal vesicular breath sounds, no wheezes or crackles  Cardiovascular system: S1 & S2 heard, RRR. No JVD, murmurs, rubs, gallops or clicks. Gastrointestinal system: Abdomen is nondistended, soft and nontender. No organomegaly or masses felt. Normal bowel sounds heard. Central nervous system: Not alert and oriented.  Extremities: No edema, no clubbing ,no cyanosis Skin: No rashes, lesions or ulcers,no icterus ,no pallor   Data Reviewed: I have personally reviewed following labs and imaging studies  CBC: Recent Labs  Lab 04/07/20 0536 04/07/20 0536 04/08/20 0518 04/09/20 0735 04/10/20 0638 04/11/20 0730 04/12/20 0702  WBC 13.3*   < > 9.2 9.3 14.0* 15.4* 13.4*  NEUTROABS 12.2*  --   --   --   --   --   --   HGB 11.6*   < > 11.1* 9.5* 9.9* 9.3* 8.8*  HCT 35.6*   < > 34.5* 28.9* 29.9* 28.1* 27.1*  MCV 98.1   < > 95.8 95.7 94.9 94.9 98.2  PLT 66*   < > 94* 86* 107* 134* 142*   < > = values in this interval not displayed.   Basic Metabolic Panel: Recent Labs  Lab 04/05/20 1241 04/05/20 1241 04/05/20 1707 04/07/20 0536 04/08/20 0518 04/09/20 0735 04/10/20 0638 04/11/20 0525  NA 139   < >  --  137 138 134* 133* 141  K 4.1   < >  --  4.2 4.2 3.9 4.1 3.4*  CL 112*   < >  --  109 108 105 102 106  CO2 19*   < >  --  21* 24 24 24 28   GLUCOSE 142*   < >  --  95  117* 127* 115* 109*  BUN 42*   < >  --  16 15 13 13 13   CREATININE 1.15*   < >  --  0.59 0.52 0.45 0.44 0.73  CALCIUM 8.1*   < >  --  8.0* 8.2* 7.6* 7.6* 9.0  MG 2.2   < > 2.2 1.9 1.9 1.8 1.7 2.1  PHOS 2.9  --  2.6  --   --   --   --   --    < > = values in this interval not displayed.   GFR: Estimated Creatinine Clearance: 43.2 mL/min (by C-G formula based on SCr of 0.73  mg/dL). Liver Function Tests: Recent Labs  Lab 04/09/20 0735 04/10/20 0638 04/11/20 0525  AST 43* 48* 17  ALT 56* 51* 16  ALKPHOS 52 52 47  BILITOT 0.6 0.7 0.6  PROT 5.3* 5.3* 6.0*  ALBUMIN 1.7* 1.6* 3.4*   No results for input(s): LIPASE, AMYLASE in the last 168 hours. No results for input(s): AMMONIA in the last 168 hours. Coagulation Profile: Recent Labs  Lab 04/07/20 1525  INR 1.0   Cardiac Enzymes: No results for input(s): CKTOTAL, CKMB, CKMBINDEX, TROPONINI in the last 168 hours. BNP (last 3 results) No results for input(s): PROBNP in the last 8760 hours. HbA1C: No results for input(s): HGBA1C in the last 72 hours. CBG: Recent Labs  Lab 04/11/20 1624 04/11/20 2022 04/12/20 0011 04/12/20 0417 04/12/20 0802  GLUCAP 123* 125* 145* 117* 142*   Lipid Profile: No results for input(s): CHOL, HDL, LDLCALC, TRIG, CHOLHDL, LDLDIRECT in the last 72 hours. Thyroid Function Tests: No results for input(s): TSH, T4TOTAL, FREET4, T3FREE, THYROIDAB in the last 72 hours. Anemia Panel: No results for input(s): VITAMINB12, FOLATE, FERRITIN, TIBC, IRON, RETICCTPCT in the last 72 hours. Sepsis Labs: Recent Labs  Lab 04/09/20 0735 04/11/20 0525  LATICACIDVEN 1.5 0.9    Recent Results (from the past 240 hour(s))  Culture, blood (routine x 2)     Status: None   Collection Time: 04/02/20  4:24 PM   Specimen: BLOOD  Result Value Ref Range Status   Specimen Description   Final    BLOOD RIGHT ANTECUBITAL Performed at [facility], [place] [address], [place], [place] [address]    Special Requests    Final    BOTTLES DRAWN AEROBIC ONLY Blood Culture results may not be optimal due to an inadequate volume of blood received in culture bottles Performed at [facility] [address], [place], [place] [address]    Culture   Final    NO GROWTH 5 DAYS Performed at [facility], [place] [address], [place], [place] [address]    Report Status 04/07/2020 FINAL  Final  Culture, blood (routine x 2)     Status: None   Collection Time: 04/02/20  4:29 PM   Specimen: BLOOD  Result Value Ref Range Status   Specimen Description   Final    BLOOD RIGHT ANTECUBITAL Performed at [facility] [address] [place], [place] [address]    Special Requests   Final    BOTTLES DRAWN AEROBIC AND ANAEROBIC Blood Culture adequate volume Performed at [facility] [address] [place], [place] [address]    Culture   Final    NO GROWTH 5 DAYS Performed at [facility], [place] [address], [place], [place] [address]    Report Status 04/07/2020 FINAL  Final  Respiratory Panel by RT PCR (Flu A&B, Covid) - Nasopharyngeal Swab     Status: None   Collection Time: 04/02/20  5:32 PM   Specimen: Nasopharyngeal Swab  Result Value Ref Range Status   SARS Coronavirus 2 by RT PCR NEGATIVE NEGATIVE Final    Comment: (NOTE) SARS-CoV-2 target nucleic acids are NOT DETECTED.  The SARS-CoV-2 RNA is generally detectable in upper respiratoy specimens during the acute phase of infection. The lowest concentration of SARS-CoV-2 viral copies this assay can detect is 131 copies/mL. A negative result does not preclude SARS-Cov-2 infection and should not be used as the sole basis for treatment or other patient management decisions. A negative  result may occur with  improper specimen collection/handling, submission of specimen other than nasopharyngeal swab, presence of viral mutation(s) within the areas targeted by this assay, and inadequate number of viral  copies (<131 copies/mL). A negative result must be combined with clinical observations, patient history, and epidemiological information. The expected result is Negative.  Fact Sheet for Patients:  PinkCheek.be  Fact Sheet for Healthcare Providers:  GravelBags.it  This test is no t yet approved or cleared by the [place] FDA and  has been authorized for detection and/or diagnosis of SARS-CoV-2 by FDA under an Emergency Use Authorization (EUA). This EUA will remain  in effect (meaning this test can be used) for the duration of the COVID-19 declaration under Section 564(b)(1) of the Act, 21 U.S.C. section 360bbb-3(b)(1), unless the authorization is terminated or revoked sooner.     Influenza A by PCR NEGATIVE NEGATIVE Final   Influenza B by PCR NEGATIVE NEGATIVE Final    Comment: (NOTE) The Xpert Xpress SARS-CoV-2/FLU/RSV assay is intended as an aid in  the diagnosis of influenza from Nasopharyngeal swab specimens and  should not be used as a sole basis for treatment. Nasal washings and  aspirates are unacceptable for Xpert Xpress SARS-CoV-2/FLU/RSV  testing.  Fact Sheet for Patients: PinkCheek.be  Fact Sheet for Healthcare Providers: GravelBags.it  This test is not yet approved or cleared by the [place] FDA and  has been authorized for detection and/or diagnosis of SARS-CoV-2 by  FDA under an Emergency Use Authorization (EUA). This EUA will remain  in effect (meaning this test can be used) for the duration of the  Covid-19 declaration under Section 564(b)(1) of the Act, 21  U.S.C. section 360bbb-3(b)(1), unless the authorization is  terminated or revoked. Performed at [facility], [place] [address], [place], [place] [address]   MRSA PCR Screening     Status: None   Collection Time: 04/02/20  8:05 PM   Specimen: Nasal Mucosa;  Nasopharyngeal  Result Value Ref Range Status   MRSA by PCR NEGATIVE NEGATIVE Final    Comment:        The GeneXpert MRSA Assay (FDA approved for NASAL specimens only), is one component of a comprehensive MRSA colonization surveillance program. It is not intended to diagnose MRSA infection nor to guide or monitor treatment for MRSA infections. Performed at [facility], [place] [address] [place], [place] [address]          Radiology Studies: No results found.      Scheduled Meds: . amoxicillin-clavulanate  500 mg Per Tube TID  . chlorhexidine  15 mL Mouth Rinse BID  . enoxaparin (LOVENOX) injection  40 mg Subcutaneous Q12H  . free water  100 mL Per Tube Q8H  . mouth rinse  15 mL Mouth Rinse q12n4p  . senna-docusate  1 tablet Per Tube QHS   Continuous Infusions: . famotidine (PEPCID) IV 20 mg (04/11/20 2223)  . feeding supplement (OSMOLITE 1.2 CAL) 1,000 mL (04/11/20 0644)     LOS: 10 days    Time spent: 35 mins,More than 50% of that time was spent in counseling and/or coordination of care.      Shelly Coss, MD [facility] P11/16/2021, 8:56 AM

## 2020-04-13 ENCOUNTER — Other Ambulatory Visit: Payer: Self-pay

## 2020-04-13 LAB — GLUCOSE, CAPILLARY
Glucose-Capillary: 70 mg/dL (ref 70–99)
Glucose-Capillary: 87 mg/dL (ref 70–99)
Glucose-Capillary: 89 mg/dL (ref 70–99)
Glucose-Capillary: 92 mg/dL (ref 70–99)

## 2020-04-13 NOTE — Progress Notes (Signed)
Patient cleaned and transport wheeled patient to ambulance via stretcher. Patient's pillow taken with the patient. Report was called. Patient's son was notified of transfer. DNR was placed in chart.

## 2020-04-13 NOTE — Progress Notes (Signed)
Manufacturing engineer [facility]) Hospital Liaison note.    Chart reviewed and eligibility confirmed. Bed offered today for [facility]. Family agreeable to transfer today. TOC aware.    Registration paperwork has been completed. Please arrange transport at any time.   RN please call report to [phone]. Please be sure that the DNR form (goldenrod) transports with the patient.  Thank you for the opportunity to participate in this patient's care.  Chrislyn Edison Pace, BSN, RN [place] (listed on [place] under [facility])    [phone]

## 2020-04-13 NOTE — TOC Transition Note (Signed)
Transition of Care [facility]) - CM/SW Discharge Note   Patient Details  Name: Catherine Alexander MRN: [id number] Date of Birth: Oct 22, 1950  Transition of Care [facility]) CM/SW Contact:  Lynnell Catalan, RN Phone Number: 04/13/2020, 12:19 PM   Clinical Narrative:    Pt to dc to [facility] today. Son aware of transport. Yellow DNR on chart for transport. [facility] called for pick up.   Final next level of care: [place] Barriers to Discharge: No Barriers Identified   Patient Goals and CMS Choice Patient states their goals for this hospitalization and ongoing recovery are:: For mom to die peacefully CMS Medicare.gov Compare Post Acute Care list provided to:: Patient Represenative (must comment) Choice offered to / list presented to : Adult Children   Discharge Plan and Services   Discharge Planning Services: CM Consult Post Acute Care Choice: Hospice

## 2020-04-13 NOTE — Discharge Summary (Signed)
Physician Discharge Summary  Catherine Alexander GEX:[id number] DOB: 1950/08/11 DOA: 04/02/2020  PCP: Kerin Perna, NP  Admit date: 04/02/2020 Discharge date: 04/13/2020  Admitted From: Home Disposition:  Residential hospice Discharge Condition:Stable CODE STATUS: DNR Diet recommendation:Regular   Brief/Interim Summary:  Patient is a 69 year old female with history of advanced Parkinson dementia who was brought to the emergency department when she was found unresponsive at home on 04/02/2020.  She has history of dementia and had poor oral intake for several days prior to admission.  On presentation she was hypotensive, unresponsive with agonal breathing so she was intubated and was sent to ICU.  She has been extubated and her respiratory status is currently stable.  Hospital course prolonged with persistent dysphagia needing feeding tube.  There was plan for skilled nursing facility placement but due to her poor quality of life, severe malnutrition, dysphagia, dementia, we strongly recommended residential hospice.  Plan for residential hospice discharge today.  Following problems were addressed during her hospitalization;   Acute hypoxic respiratory failure: Suspected to be from aspiration pneumonia.  Intubated in the emergency department, now extubated on room air.  She was initially on Unasyn which was changed to Augmentin.Abx stopped now.  Dysphagia/aspiration/failure to thrive: Unable to swallow.  Speech therapist following and recommending only nectar thick liquid.  Feeding tube discontinued .  Acute DVT: Was on Lovenox.  Discontinued anticoagulation  Acute thrombocytopenia: No signs of bleeding.  Thrombocytopenia likely associated  with acute DVT.   Currently hemoglobin stable.  Hypernatremia: Resolved  Hypokalemia:Was supplemented with potassium.  AKI: Resolved with IV fluids  Severe protein calorie malnutrition: BMI of 15.5  Goals of care/Parkinson's disease/dementia:  Multiple comorbidities in this demented patient.  Overall prognosis very poor.  Palliative care closely following and recommended outpatient follow-up.  She is a DNR. We strongly recommend residential hospice for this patient  Discharge Diagnoses:  Active Problems:   Acute hypercapnic respiratory failure (HCC)   Protein-calorie malnutrition, severe    Discharge Instructions  Discharge Instructions    Diet general   Complete by: As directed      Allergies as of 04/13/2020   No Known Allergies     Medication List    You have not been prescribed any medications.     No Known Allergies  Consultations:  Palliative care   Procedures/Studies: DG Abd 1 View  Result Date: 04/06/2020 CLINICAL DATA:  Enteric catheter placement EXAM: ABDOMEN - 1 VIEW COMPARISON:  04/04/2020 FINDINGS: Semi-erect frontal view of the lower chest and upper abdomen was performed. I do not seen enteric catheter on the included portions of the lower chest or upper abdomen. Dedicated chest x-ray may be useful to evaluate for enteric catheter malpositioning. Bowel gas pattern is unremarkable. Multifocal airspace disease is seen at the lung bases, which has progressed since prior study. IMPRESSION: 1. Enteric catheter is not identified on this exam. Chest x-ray may be useful to assess for catheter malpositioning. 2. Worsening multifocal bilateral pneumonia. These results will be called to the ordering clinician or representative by the Radiologist Assistant, and communication documented in the PACS or [employer]. Electronically Signed   By: Randa Ngo M.D.   On: 04/06/2020 21:47   DG Abd 1 View  Result Date: 04/04/2020 CLINICAL DATA:  Check feeding catheter placement EXAM: ABDOMEN - 1 VIEW COMPARISON:  None. FINDINGS: Weighted feeding catheter is noted within the mid stomach. IMPRESSION: Feeding catheter within the stomach. Electronically Signed   By: Inez Catalina M.D.   On:  04/04/2020 14:35   CT Head  Wo Contrast  Result Date: 04/02/2020 CLINICAL DATA:  Question of cerebral hemorrhage EXAM: CT HEAD WITHOUT CONTRAST TECHNIQUE: Contiguous axial images were obtained from the base of the skull through the vertex without intravenous contrast. COMPARISON:  January 24, 2020 FINDINGS: Brain: No evidence of acute territorial infarction, hemorrhage, hydrocephalus,extra-axial collection or mass lesion/mass effect. There is dilatation the ventricles and sulci consistent with age-related atrophy. Low-attenuation changes in the deep white matter consistent with small vessel ischemia. Vascular: No hyperdense vessel or unexpected calcification. Skull: The skull is intact. No fracture or focal lesion identified. Sinuses/Orbits: Mucosal thickening and a small amount of fluid is seen within the bilateral maxillary sinuses. The orbits and globes intact. Other: None IMPRESSION: No acute intracranial abnormality. Findings consistent with age related atrophy and chronic small vessel ischemia Maxillary sinusitis Electronically Signed   By: Prudencio Pair M.D.   On: 04/02/2020 18:25   DG CHEST PORT 1 VIEW  Result Date: 04/07/2020 CLINICAL DATA:  Possible aspiration. EXAM: PORTABLE CHEST 1 VIEW COMPARISON:  April 04, 2020. FINDINGS: Stable cardiomediastinal silhouette. No pneumothorax is noted. Increased bibasilar opacities are noted concerning for possible pneumonia or edema. Small left pleural effusion may be present. Endotracheal and nasogastric tubes have been removed. Bony thorax is unremarkable. IMPRESSION: Increased bibasilar opacities are noted concerning for possible pneumonia or edema. Small left pleural effusion may be present. Endotracheal and nasogastric tubes have been removed. Electronically Signed   By: Marijo Conception M.D.   On: 04/07/2020 14:21   DG Chest Port 1 View  Result Date: 04/04/2020 CLINICAL DATA:  Respiratory failure. EXAM: PORTABLE CHEST 1 VIEW COMPARISON:  April 02, 2020. FINDINGS: The heart size  and mediastinal contours are within normal limits. Endotracheal tube and nasogastric tube are in grossly good position. No pneumothorax or pleural effusion is noted. Left lung is clear. Increased right midlung and basilar opacity is noted concerning for possible pneumonia. Hyperexpansion of the lungs is noted. The visualized skeletal structures are unremarkable. IMPRESSION: Endotracheal and nasogastric tubes in grossly good position. Increased right midlung and basilar opacity is noted concerning for possible pneumonia. Electronically Signed   By: Marijo Conception M.D.   On: 04/04/2020 09:21   DG Chest Portable 1 View  Result Date: 04/02/2020 CLINICAL DATA:  Unresponsive EXAM: PORTABLE CHEST 1 VIEW COMPARISON:  January 24, 2020 FINDINGS: The heart size and mediastinal contours are within normal limits. Aortic knob calcification is seen. ETT is 2.5 cm above the level of the carina. Both lungs are clear. There is hyperinflation of the lung zones. The visualized skeletal structures are unremarkable. Probable small hiatal hernia is noted. IMPRESSION: No active disease. Electronically Signed   By: Prudencio Pair M.D.   On: 04/02/2020 17:59   DG Abd Portable 1V  Result Date: 04/07/2020 CLINICAL DATA:  Feeding tube placement EXAM: PORTABLE ABDOMEN - 1 VIEW COMPARISON:  04/06/2020 FINDINGS: Interval placement of weighted enteric feeding tube, tip positioned below the diaphragm near the gastric pylorus. Metallic stylette remains in position. Nonobstructive pattern of bowel gas. IMPRESSION: Interval placement of weighted enteric feeding tube, tip positioned below the diaphragm near the gastric pylorus. Metallic stylette remains in position. Electronically Signed   By: Eddie Candle M.D.   On: 04/07/2020 15:05   DG Abd Portable 1 View  Result Date: 04/02/2020 CLINICAL DATA:  Orogastric tube placement. EXAM: PORTABLE ABDOMEN - 1 VIEW COMPARISON:  None. FINDINGS: Enteric tube tip below the diaphragm in the stomach,  side-port  in the region of the distal esophagus. Air-filled stomach. Moderate stool in the colon. No definite small bowel dilatation. Lung bases are clear. IMPRESSION: Enteric tube tip below the diaphragm in the stomach, side-port in the region of the distal esophagus. Recommend advancement of at least 5 cm for optimal placement. Electronically Signed   By: Keith Rake M.D.   On: 04/02/2020 19:06   VAS [place] LOWER EXTREMITY VENOUS (DVT)  Result Date: 04/07/2020  Lower Venous DVT Study Indications: Pain.  Risk Factors: Immobility. Performing Technologist: Griffin Basil RCT RDMS  Examination Guidelines: A complete evaluation includes B-mode imaging, spectral Doppler, color Doppler, and power Doppler as needed of all accessible portions of each vessel. Bilateral testing is considered an integral part of a complete examination. Limited examinations for reoccurring indications may be performed as noted. The reflux portion of the exam is performed with the patient in reverse Trendelenburg.  +---------+---------------+---------+-----------+----------+--------------+  RIGHT     Compressibility Phasicity Spontaneity Properties Thrombus Aging  +---------+---------------+---------+-----------+----------+--------------+  CFV       Full            Yes       Yes                                    +---------+---------------+---------+-----------+----------+--------------+  SFJ       Full                                                             +---------+---------------+---------+-----------+----------+--------------+  FV Prox   Full                                                             +---------+---------------+---------+-----------+----------+--------------+  FV Mid    Full                                                             +---------+---------------+---------+-----------+----------+--------------+  FV Distal Full                                                              +---------+---------------+---------+-----------+----------+--------------+  PFV       Full                                                             +---------+---------------+---------+-----------+----------+--------------+  POP       Full            Yes  Yes                                    +---------+---------------+---------+-----------+----------+--------------+  PTV       Full                                                             +---------+---------------+---------+-----------+----------+--------------+   +---------+---------------+---------+-----------+---------------+--------------+  LEFT      Compressibility Phasicity Spontaneity Properties      Thrombus Aging  +---------+---------------+---------+-----------+---------------+--------------+  CFV       Full            Yes       Yes                                         +---------+---------------+---------+-----------+---------------+--------------+  SFJ       Full                                                                  +---------+---------------+---------+-----------+---------------+--------------+  FV Prox   Full                                                                  +---------+---------------+---------+-----------+---------------+--------------+  FV Mid    Full                                                                  +---------+---------------+---------+-----------+---------------+--------------+  FV Distal Full                                                                  +---------+---------------+---------+-----------+---------------+--------------+  PFV       Full                                                                  +---------+---------------+---------+-----------+---------------+--------------+  POP       Full            Yes       Yes                                         +---------+---------------+---------+-----------+---------------+--------------+  PTV       None                                   softly          Acute                                                            echogenic                       +---------+---------------+---------+-----------+---------------+--------------+  PERO      Full                                                                  +---------+---------------+---------+-----------+---------------+--------------+   Left Technical Findings: 1 posterior Tibial Vein Thrombus   Summary: RIGHT: - There is no evidence of deep vein thrombosis in the lower extremity.  - No cystic structure found in the popliteal fossa.  LEFT: - Findings consistent with acute deep vein thrombosis involving the left posterior tibial veins. - No cystic structure found in the popliteal fossa.  *See table(s) above for measurements and observations. Electronically signed by Monica Martinez MD on 04/07/2020 at 4:20:05 PM.    Final         Discharge Exam: Vitals:   04/12/20 2138 04/13/20 0420  BP: 123/65 (!) 112/53  Pulse: 96 88  Resp: 16 14  Temp: 98.5 F (36.9 C) 98.7 F (37.1 C)  SpO2: 96% 96%   Vitals:   04/12/20 0422 04/12/20 1357 04/12/20 2138 04/13/20 0420  BP: 133/61 (!) 128/52 123/65 (!) 112/53  Pulse: 86 93 96 88  Resp: 20 16 16 14   Temp: 97.9 F (36.6 C) 98.1 F (36.7 C) 98.5 F (36.9 C) 98.7 F (37.1 C)  TempSrc: Oral Oral Oral Oral  SpO2: 92% 96% 96% 96%  Weight:    43.6 kg  Height:        General: Pt is lethargic,cachetic Cardiovascular: RRR, S1/S2 +, no rubs, no gallops Respiratory: CTA bilaterally, no wheezing, no rhonchi Abdominal: Soft, NT, ND, bowel sounds + Extremities: no edema, no cyanosis    The results of significant diagnostics from this hospitalization (including imaging, microbiology, ancillary and laboratory) are listed below for reference.     Microbiology: No results found for this or any previous visit (from the past 240 hour(s)).   Labs: BNP (last 3 results) No results for input(s): BNP in the last 8760  hours. Basic Metabolic Panel: Recent Labs  Lab 04/07/20 0536 04/08/20 0518 04/09/20 0735 04/10/20 0638 04/11/20 0525  NA 137 138 134* 133* 141  K 4.2 4.2 3.9 4.1 3.4*  CL 109 108 105 102 106  CO2 21* 24 24 24 28   GLUCOSE 95 117* 127* 115* 109*  BUN 16 15 13 13 13   CREATININE 0.59 0.52 0.45 0.44 0.73  CALCIUM 8.0* 8.2* 7.6* 7.6* 9.0  MG 1.9 1.9 1.8 1.7 2.1   Liver Function Tests: Recent Labs  Lab 04/09/20 0735 04/10/20  6789 04/11/20 0525  AST 43* 48* 17  ALT 56* 51* 16  ALKPHOS 52 52 47  BILITOT 0.6 0.7 0.6  PROT 5.3* 5.3* 6.0*  ALBUMIN 1.7* 1.6* 3.4*   No results for input(s): LIPASE, AMYLASE in the last 168 hours. No results for input(s): AMMONIA in the last 168 hours. CBC: Recent Labs  Lab 04/07/20 0536 04/07/20 0536 04/08/20 0518 04/09/20 0735 04/10/20 0638 04/11/20 0730 04/12/20 0702  WBC 13.3*   < > 9.2 9.3 14.0* 15.4* 13.4*  NEUTROABS 12.2*  --   --   --   --   --   --   HGB 11.6*   < > 11.1* 9.5* 9.9* 9.3* 8.8*  HCT 35.6*   < > 34.5* 28.9* 29.9* 28.1* 27.1*  MCV 98.1   < > 95.8 95.7 94.9 94.9 98.2  PLT 66*   < > 94* 86* 107* 134* 142*   < > = values in this interval not displayed.   Cardiac Enzymes: No results for input(s): CKTOTAL, CKMB, CKMBINDEX, TROPONINI in the last 168 hours. BNP: Invalid input(s): POCBNP CBG: Recent Labs  Lab 04/12/20 1559 04/12/20 2023 04/13/20 0018 04/13/20 0426 04/13/20 0753  GLUCAP 84 70 87 92 89   D-Dimer No results for input(s): DDIMER in the last 72 hours. Hgb A1c No results for input(s): HGBA1C in the last 72 hours. Lipid Profile No results for input(s): CHOL, HDL, LDLCALC, TRIG, CHOLHDL, LDLDIRECT in the last 72 hours. Thyroid function studies No results for input(s): TSH, T4TOTAL, T3FREE, THYROIDAB in the last 72 hours.  Invalid input(s): FREET3 Anemia work up No results for input(s): VITAMINB12, FOLATE, FERRITIN, TIBC, IRON, RETICCTPCT in the last 72 hours. Urinalysis    Component Value Date/Time    COLORURINE AMBER (A) 04/02/2020 1754   APPEARANCEUR HAZY (A) 04/02/2020 1754   LABSPEC 1.020 04/02/2020 1754   PHURINE 5.0 04/02/2020 1754   GLUCOSEU NEGATIVE 04/02/2020 1754   HGBUR SMALL (A) 04/02/2020 1754   BILIRUBINUR NEGATIVE 04/02/2020 1754   KETONESUR NEGATIVE 04/02/2020 1754   PROTEINUR 30 (A) 04/02/2020 1754   NITRITE NEGATIVE 04/02/2020 1754   LEUKOCYTESUR NEGATIVE 04/02/2020 1754   Sepsis Labs Invalid input(s): PROCALCITONIN,  WBC,  LACTICIDVEN Microbiology No results found for this or any previous visit (from the past 240 hour(s)).  Please note: You were cared for by a hospitalist during your hospital stay. Once you are discharged, your primary care physician will handle any further medical issues. Please note that NO REFILLS for any discharge medications will be authorized once you are discharged, as it is imperative that you return to your primary care physician (or establish a relationship with a primary care physician if you do not have one) for your post hospital discharge needs so that they can reassess your need for medications and monitor your lab values.    Time coordinating discharge: 40 minutes  SIGNED:   Shelly Coss, MD  [facility] 04/13/2020, 8:19 AM Pager [phone]  If 7PM-7AM, please contact night-coverage [email or web address] Password [email or web address]

## 2020-04-27 DEATH — deceased

## 2020-06-01 ENCOUNTER — Ambulatory Visit: Payer: Self-pay | Admitting: Neurology

## 2021-04-25 IMAGING — DX DG CHEST 1V PORT
1 series · 1 of 1 positions shown · non-contrast
Comparison: April 04, 2020.

CLINICAL DATA: Possible aspiration.

EXAM:
PORTABLE CHEST 1 VIEW

[chest ap]
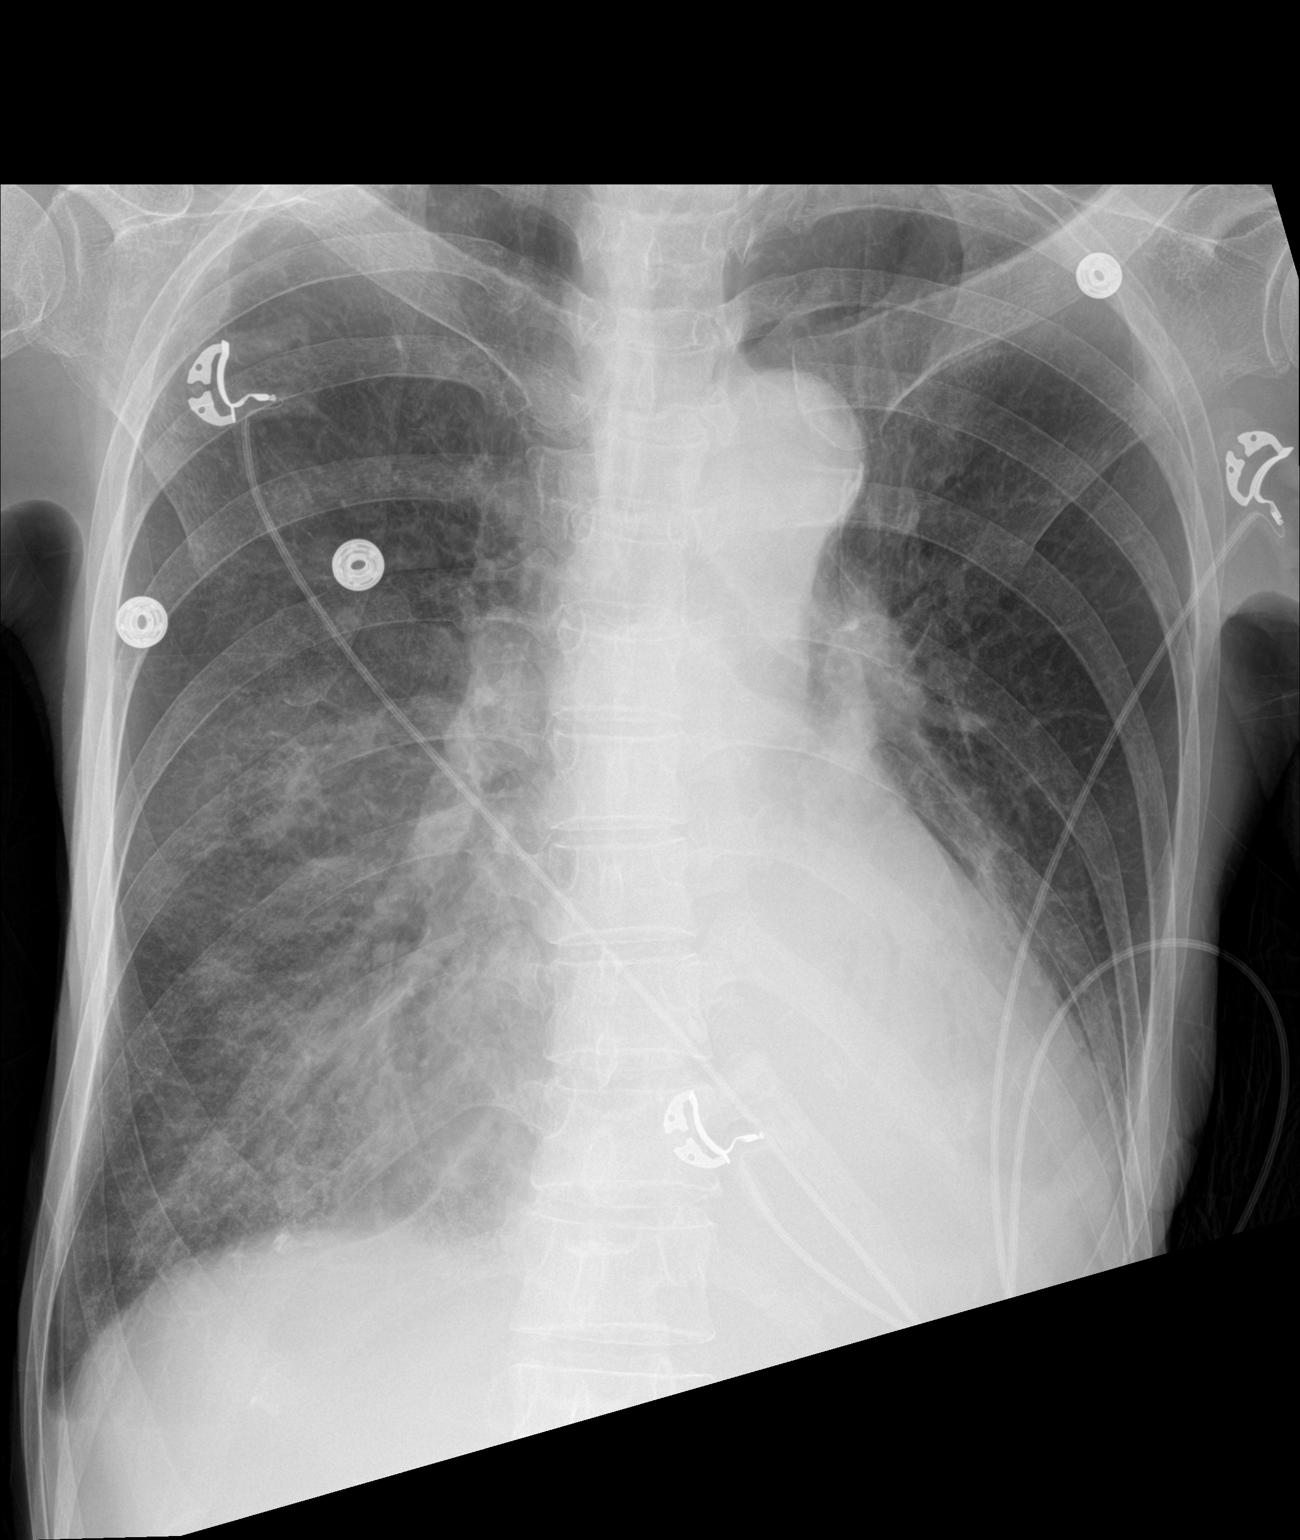

[1 of 1 positions shown; findings below may reference images not displayed]

FINDINGS: Stable cardiomediastinal silhouette. No pneumothorax is noted.
Increased bibasilar opacities are noted concerning for possible
pneumonia or edema. Small left pleural effusion may be present.
Endotracheal and nasogastric tubes have been removed. Bony thorax is
unremarkable.
IMPRESSION: Increased bibasilar opacities are noted concerning for possible
pneumonia or edema. Small left pleural effusion may be present.
Endotracheal and nasogastric tubes have been removed.

## 2021-04-25 IMAGING — DX DG ABD PORTABLE 1V
1 series · 1 of 1 positions shown · non-contrast
Comparison: 04/06/2020

CLINICAL DATA: Feeding tube placement

EXAM:
PORTABLE ABDOMEN - 1 VIEW

[abdomen kub]
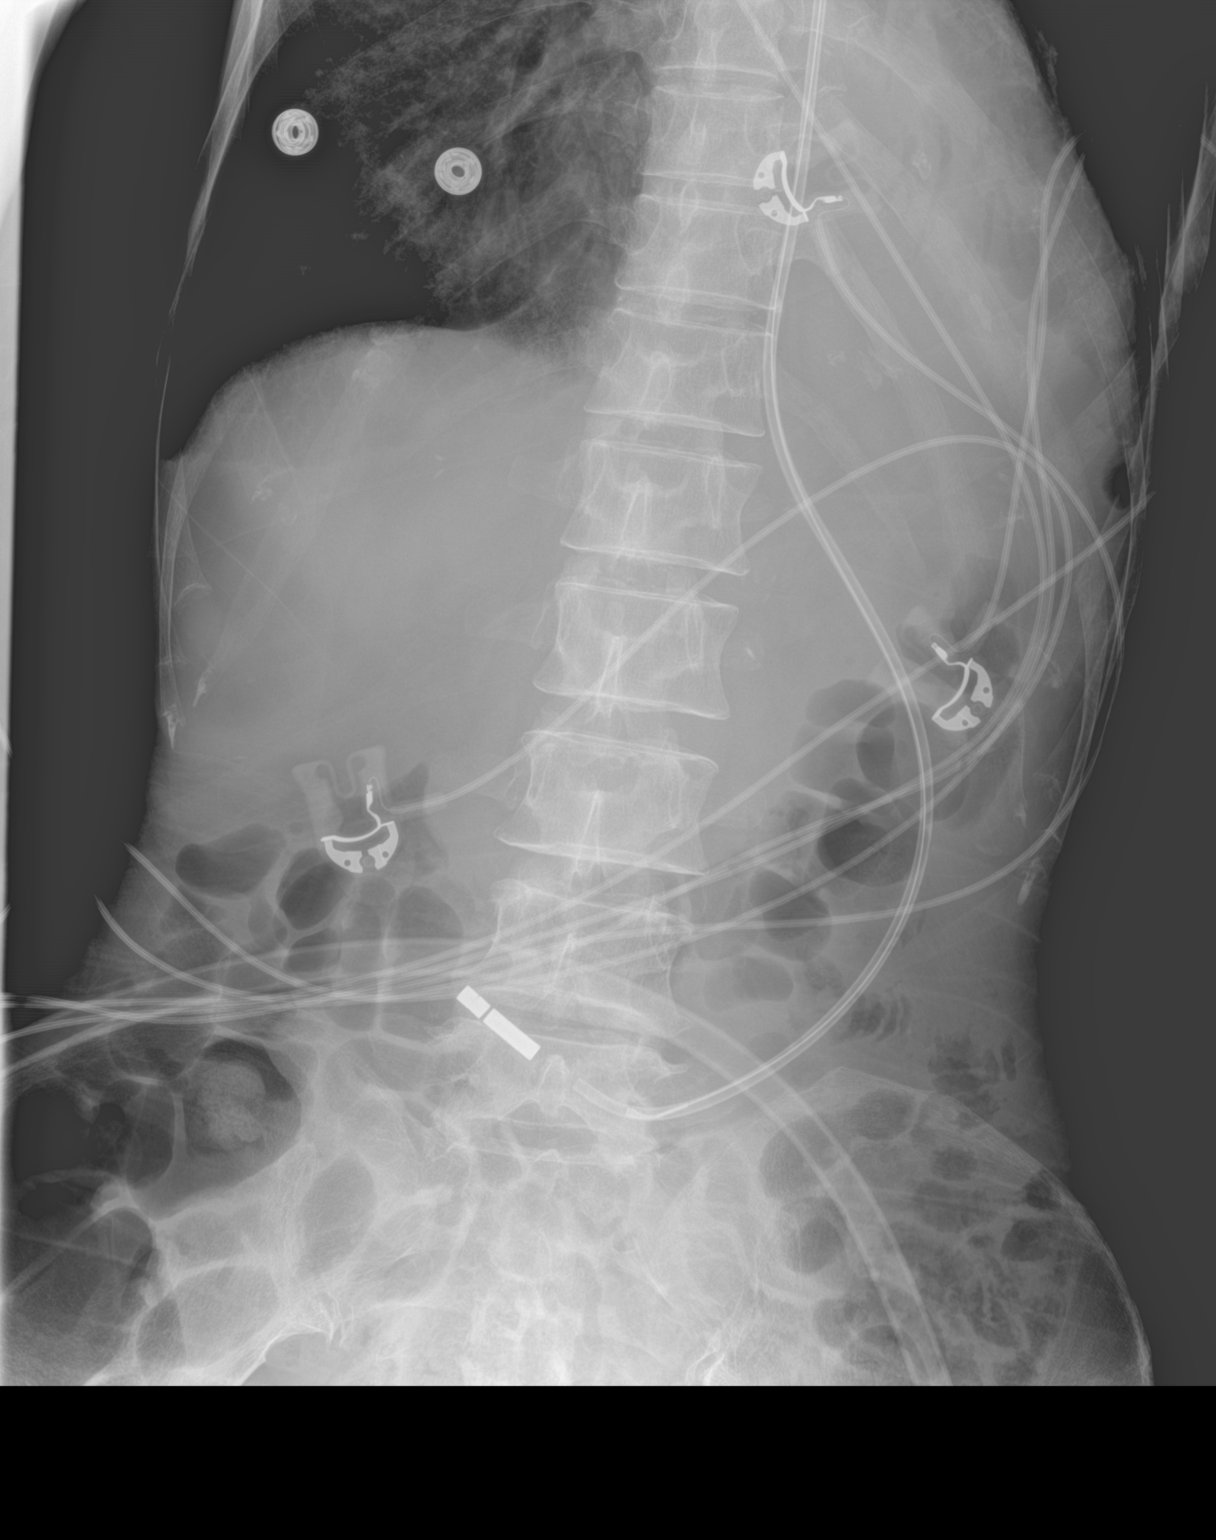

[1 of 1 positions shown; findings below may reference images not displayed]

FINDINGS: Interval placement of weighted enteric feeding tube, tip positioned
below the diaphragm near the gastric pylorus. Metallic stylette
remains in position. Nonobstructive pattern of bowel gas.
IMPRESSION: Interval placement of weighted enteric feeding tube, tip positioned
below the diaphragm near the gastric pylorus. Metallic stylette
remains in position.
